# Patient Record
Sex: Female | Born: 1935 | Race: White | Hispanic: No | Marital: Married | Smoking: Never smoker
Health system: Southern US, Community
[De-identification: ages and names within clinical notes are randomized; demographics above are authoritative.]

## PROBLEM LIST (undated history)

## (undated) DIAGNOSIS — R51 Headache: Secondary | ICD-10-CM

## (undated) DIAGNOSIS — F329 Major depressive disorder, single episode, unspecified: Secondary | ICD-10-CM

## (undated) DIAGNOSIS — F32A Depression, unspecified: Secondary | ICD-10-CM

## (undated) DIAGNOSIS — R519 Headache, unspecified: Secondary | ICD-10-CM

## (undated) HISTORY — PX: CHOLECYSTECTOMY: SHX55

## (undated) HISTORY — PX: TOE SURGERY: SHX1073

## (undated) HISTORY — PX: ABDOMINAL HYSTERECTOMY: SHX81

## (undated) HISTORY — PX: EYE SURGERY: SHX253

---

## 1999-04-25 ENCOUNTER — Encounter: Payer: Self-pay | Admitting: Emergency Medicine

## 1999-04-25 ENCOUNTER — Emergency Department (HOSPITAL_COMMUNITY): Admission: EM | Admit: 1999-04-25 | Discharge: 1999-04-25 | Payer: Self-pay | Admitting: Emergency Medicine

## 1999-06-23 ENCOUNTER — Encounter: Payer: Self-pay | Admitting: Orthopedic Surgery

## 1999-06-23 ENCOUNTER — Encounter: Admission: RE | Admit: 1999-06-23 | Discharge: 1999-06-23 | Payer: Self-pay | Admitting: Orthopedic Surgery

## 1999-12-14 ENCOUNTER — Encounter: Payer: Self-pay | Admitting: Family Medicine

## 1999-12-14 ENCOUNTER — Encounter: Admission: RE | Admit: 1999-12-14 | Discharge: 1999-12-14 | Payer: Self-pay | Admitting: Family Medicine

## 2000-12-16 ENCOUNTER — Encounter: Admission: RE | Admit: 2000-12-16 | Discharge: 2000-12-16 | Payer: Self-pay | Admitting: Family Medicine

## 2000-12-16 ENCOUNTER — Encounter: Payer: Self-pay | Admitting: Family Medicine

## 2001-08-01 ENCOUNTER — Ambulatory Visit (HOSPITAL_COMMUNITY): Admission: RE | Admit: 2001-08-01 | Discharge: 2001-08-01 | Payer: Self-pay | Admitting: *Deleted

## 2001-12-23 ENCOUNTER — Encounter: Payer: Self-pay | Admitting: Family Medicine

## 2001-12-23 ENCOUNTER — Encounter: Admission: RE | Admit: 2001-12-23 | Discharge: 2001-12-23 | Payer: Self-pay | Admitting: Family Medicine

## 2003-01-27 ENCOUNTER — Encounter: Admission: RE | Admit: 2003-01-27 | Discharge: 2003-01-27 | Payer: Self-pay | Admitting: Family Medicine

## 2003-01-27 ENCOUNTER — Encounter: Payer: Self-pay | Admitting: Family Medicine

## 2003-05-18 ENCOUNTER — Encounter: Payer: Self-pay | Admitting: Family Medicine

## 2003-05-18 ENCOUNTER — Ambulatory Visit (HOSPITAL_COMMUNITY): Admission: RE | Admit: 2003-05-18 | Discharge: 2003-05-18 | Payer: Self-pay | Admitting: Family Medicine

## 2003-06-04 ENCOUNTER — Ambulatory Visit (HOSPITAL_COMMUNITY): Admission: RE | Admit: 2003-06-04 | Discharge: 2003-06-04 | Payer: Self-pay | Admitting: Family Medicine

## 2003-07-09 ENCOUNTER — Encounter: Admission: RE | Admit: 2003-07-09 | Discharge: 2003-08-03 | Payer: Self-pay | Admitting: Family Medicine

## 2003-12-08 ENCOUNTER — Other Ambulatory Visit: Admission: RE | Admit: 2003-12-08 | Discharge: 2003-12-08 | Payer: Self-pay | Admitting: Family Medicine

## 2004-02-03 ENCOUNTER — Encounter: Admission: RE | Admit: 2004-02-03 | Discharge: 2004-02-03 | Payer: Self-pay | Admitting: Family Medicine

## 2005-04-26 ENCOUNTER — Ambulatory Visit (HOSPITAL_COMMUNITY): Admission: RE | Admit: 2005-04-26 | Discharge: 2005-04-27 | Payer: Self-pay | Admitting: Urology

## 2005-05-30 ENCOUNTER — Encounter: Admission: RE | Admit: 2005-05-30 | Discharge: 2005-05-30 | Payer: Self-pay | Admitting: Family Medicine

## 2005-12-03 ENCOUNTER — Encounter (INDEPENDENT_AMBULATORY_CARE_PROVIDER_SITE_OTHER): Payer: Self-pay | Admitting: *Deleted

## 2005-12-03 ENCOUNTER — Ambulatory Visit (HOSPITAL_COMMUNITY): Admission: RE | Admit: 2005-12-03 | Discharge: 2005-12-04 | Payer: Self-pay | Admitting: General Surgery

## 2006-06-03 ENCOUNTER — Encounter: Admission: RE | Admit: 2006-06-03 | Discharge: 2006-06-03 | Payer: Self-pay | Admitting: Family Medicine

## 2007-07-02 ENCOUNTER — Encounter: Admission: RE | Admit: 2007-07-02 | Discharge: 2007-07-02 | Payer: Self-pay | Admitting: Family Medicine

## 2007-12-19 ENCOUNTER — Other Ambulatory Visit: Admission: RE | Admit: 2007-12-19 | Discharge: 2007-12-19 | Payer: Self-pay | Admitting: Family Medicine

## 2008-03-31 ENCOUNTER — Encounter: Admission: RE | Admit: 2008-03-31 | Discharge: 2008-04-30 | Payer: Self-pay | Admitting: Orthopaedic Surgery

## 2008-07-07 ENCOUNTER — Encounter: Admission: RE | Admit: 2008-07-07 | Discharge: 2008-07-07 | Payer: Self-pay | Admitting: Family Medicine

## 2009-06-10 ENCOUNTER — Encounter: Admission: RE | Admit: 2009-06-10 | Discharge: 2009-06-10 | Payer: Self-pay | Admitting: Family Medicine

## 2009-07-22 ENCOUNTER — Encounter: Admission: RE | Admit: 2009-07-22 | Discharge: 2009-07-22 | Payer: Self-pay | Admitting: Family Medicine

## 2010-08-27 ENCOUNTER — Encounter: Payer: Self-pay | Admitting: Family Medicine

## 2010-12-22 NOTE — Op Note (Signed)
NAME:  Gail Noble, Gail Noble            ACCOUNT NO.:  [id number]   MEDICAL RECORD NO.:  [id number]          PATIENT TYPE:  AMB   LOCATION:  DAY                          FACILITY:  [facility]   PHYSICIAN:  Excell Seltzer. Annabell Howells, M.D.    DATE OF BIRTH:  March 28, 1934   DATE OF PROCEDURE:  04/26/2005  DATE OF DISCHARGE:                                 OPERATIVE REPORT   PREOPERATIVE DIAGNOSES:  1.  Stress urinary incontinence.  2.  Cystocele.   POSTOPERATIVE DIAGNOSES:  1.  Stress urinary incontinence.  2.  Cystocele.   PROCEDURE:  1.  Anterior repair with mesh.  2.  Pubovaginal sling.   SURGEON:  Dr. Bjorn Pippin.   ASSISTANTS:  1.  Dr. Lorin Picket MacDiarmid.  2.  Dr. Glade Nurse   ANESTHESIA:  General.   SPECIMENS:  None.   DRAINS:  None.   PROCEDURE:  The patient was identified by wrist bracelet and brought to room  10 where she received preoperative antibiotics and was administered general  anesthesia.  She was prepped and draped in the usual sterile fashion, taking  care to prevent peripheral neuropathy and compartment syndrome.  Next a  weighted speculum was placed into her vagina, exposing her  anterior/posterior vagina and apex.  She was noted to have a mild to  moderate cystocele with excellent apical and posterior support.  Next we  placed a Foley catheter in anterior urethra; clear urine output was  obtained.  Next using 1% Marcaine with epinephrine, we hydrodissected the  anterior vaginal wall overlying the urethra to provide better dissection  planes and analgesia as well as hemostasis.  Next we made a 4-5 cm incision  approximately 3 cm proximal to the urethral meatus along the axis of the  vagina over the cystocele.  We sharply and bluntly dissected out thick  vaginal flaps.  We were able to dissect bilaterally, to but not through, the  endocervical fascia anteriorly and back to the ischial spine posteriorly.  Next we made 0.5 cm vertical skin incisions 5 cm lateral to the  clitoris  bilaterally, just lateral to the pubic rami.  We then made secondary marks 3  cm inferior and 2 cm lateral to the aforementioned marks over the, again,  the lateral edge of the pubic ramus.  Next, starting with the superior  sites, we passed the introducer trocars along the lateral aspect of the  pubic ramus to the surgeon's finger, out at the level of the pubovesical  fascia lateral to the Foley catheter.  This was done bilaterally and the  perigee superior arms were attached to the trocars.  Next this was repeated  with the posterior ports, [place] placing the surgeon's first finger on the right  ischial spine.  We passed the trocar through the level of skin just lateral  to the pubic ramus as close to the ischial spine as possible and back out at  the level of the pubocervical fascia.  Again, as with the superior  introducers, we checked the vaginal apex to make sure there was no  buttonholing of the vaginal fornix.  This was  repeated on the patient's  left.  Next we attached to the posterior arms of the perigee anterior repair  system.  The Foley catheter was then removed.  The patient underwent  pancystourethroscopy with 70-degree lenses with 400 mL of sterile water.  There were no foreign bodies or mucosal bleeding visualized.  Next we  removed the scope.  The bladder was drained again with Foley catheter.  The  trocars were pulled through, and the anterior peritoneum was set to the  appropriate tension reducing the cystocele.  Next the anterior hammock axis  polypropylene was removed, and it was affixed to the cystocele at the apex  both anterior and posteriorly at the midline with interrupted 2-0 Vicryl.  Next the coating over the kits were removed from the arms.  The arms were  trimmed at the level of the skin.  A Dermabond dressing was applied.  Next  the vaginal incision was closed with a running 3-0 Vicryl locking every  third suture.  Next we turned our attention to the  area of vaginal mucosa  between the superior portion of the aforementioned described incision in the  urethral meatus and mad a small 1 cm incision and created generous vaginal  flaps and dissected to but not through the pubocervical fascia.  Next we  made again 0.5 cm incisions 1/2 fingerbreadth superior to the pubic  symphysis, 1 cm each lateral to the midline.  Next using the spark  introducer trocars, we placed them through the dermis, on top of the pubic  ramus, guided down the posterior pubic ramus under the surgeon's first  finger and out the pubocervical fascia.  This was then repeated at the other  incision sites, coming out the contralateral pubocervical fascia.  We  checked to make sure there was no buttonholing of the introducers through  the vaginal mucosa.  Next the spark sling was attached, and the trocars were  removed.  It was appropriately tensioned.  Next we repeated  cystourethroscopy with 70-degree lens, 400 mL of sterile water.  No foreign  bodies or mucosal bleeding was seen.  The bilateral ureteral orifices were  seen to efflux clear urine bilaterally.  Next cystoscope was removed;  bladder was drained with Foley catheter.  The plastic covering over the  [facility] sling was removed.  It was trimmed at the level of skin.  Again, we  double-checked our tension by placing the DeBakey forceps behind the sling  anterior to the urethra proximally with one forceps with tension in between.  Next the vaginal mucosa was closed with running 3-0 chromic; the skin was  closed with Dermabond dressing, and the vagina was packed with Iodoform  gauze.  The patient's Foley catheter was attached to drainage bag.  He was  reversed from anesthesia which she tolerated without complication.  Please  note Dr. Bjorn Pippin was present and participated in all aspects of this  case.     ______________________________  Glade Nurse, MD      Excell Seltzer. Annabell Howells, M.D. Electronically Signed     MT/MEDQ  D:  04/26/2005  T:  04/26/2005  Job:  [id number]

## 2010-12-22 NOTE — Op Note (Signed)
NAMEGIANNY, Gail Noble               ACCOUNT NO.:  [id number]   MEDICAL RECORD NO.:  [id number]          PATIENT TYPE:  AMB   LOCATION:  SDS                          FACILITY:  [facility]   PHYSICIAN:  Gabrielle Dare. Janee Morn, M.D.DATE OF BIRTH:  Dec 01, 1933   DATE OF PROCEDURE:  12/03/2005  DATE OF DISCHARGE:                                 OPERATIVE REPORT   PREOPERATIVE DIAGNOSIS:  Symptomatic cholelithiasis   POSTOPERATIVE DIAGNOSIS:  Symptomatic cholelithiasis  Same.   PROCEDURE:  Laparoscopic cholecystectomy with intraoperative cholangiogram.   SURGEON:  Gabrielle Dare. Janee Morn, M.D.,   ASSISTANT:  Gita Kudo, M.D.   HISTORY OF PRESENT ILLNESS:  The patient is a 77 year old female who was  evaluated for episodic right upper quadrant pain.  Ultrasound demonstrated a  gallstone without any active cholecystitis.  She continued to have symptoms  and presents for elective cholecystectomy.   PROCEDURE IN DETAIL:  Informed consent was obtained.  The patient was  identified in the preop holding area.  She received intravenous antibiotics.  She was taken to the operating room.  General anesthesia was administered.  Her abdomen was prepped and draped in sterile a fashion.  The infraumbilical  area was infiltrated with 0.25% Marcaine with epinephrine.  An  infraumbilical incision was made.  Subcutaneous tissues were dissected down  revealing the anterior fascia.  This was divided sharply.  The peritoneal  cavity was then entered under direct vision without difficulty.  A Vicryl  pursestring suture was placed around the fascial opening.  A Hasson trocar  was inserted into the abdomen and the abdomen was insufflated with carbon  dioxide in standard fashion.  Laparoscopic exploration revealed a revealed  no obvious abnormalities.  Under direct vision an 11 mm epigastric and two 5-  mm lateral ports were then placed.  Marcaine 0.25% with epinephrine was used  at all port sites.  The dome of the  gallbladder was retracted  superomedially.  There were several filmy omental adhesions, which were  swept down off the gallbladder using Bovie cautery for excellent hemostasis.  This revealed the infundibulum.  The infundibulum was retracted  inferolaterally.  Dissection began laterally and progressed medially, easily  identifying the cystic duct.  Dissection continued until a large window was  created around the cystic duct between the cystic duct, the infundibulum of  the gallbladder and the liver.  Once this was accomplished with excellent  visualization, a clip was placed on the infundibulum-cystic duct junction.  A small nick was made in the cystic duct and a Reddick cholangiogram  catheter was inserted.  Intraoperative cholangiogram was obtained, which  demonstrated no common bile duct filling defects and a good length of cystic  duct.  The cholangiogram was completed and the catheter was removed.  Three  clips were placed proximally on the cystic duct and it was divided.  Further  dissection revealed an anterior branch of cystic artery, which was clipped  three times proximally, once distally, and divided.  The gallbladder was  then taken off the liver bed carefully.  Further dissection revealed a  posterior branch of  the cystic artery, which was clipped twice proximally,  once distally, and divided.  The gallbladder was removed from the liver bed  using the Bovie cautery and getting excellent hemostasis.  It was placed in  EndoCatch bag and taken out of the abdomen via the infraumbilical port site.  The gallbladder bed was rechecked and hemostasis was assured with Bovie  cautery.  Clips were in great position.  The abdomen was irrigated.  The  irrigation fluid returned clear.  Once irrigation fluid was evacuated, the  liver bed was rechecked and there was no bleeding or bile leakage.  Ports  were then removed under direct vision.  The pneumoperitoneum was released.  The Hasson  trocar was removed.  The infraumbilical fascia was closed by  tying the 0 Vicryl pursestring suture with care not to trap any intra-  abdominal contents.  All four wounds were copiously irrigated.  Some  additional local anesthetic was injected and the skin of each was closed  with running a 4-0 Vicryl subcuticular stitch.  Sponge, needle and  instrument counts were correct.  Benzoin, Steri-Strips and sterile dressings  were applied.  The patient tolerated the procedure well without apparent  complication and was taken to the recovery room in stable condition.      Gabrielle Dare Janee Morn, M.D.  Electronically Signed     BET/MEDQ  D:  12/03/2005  T:  12/03/2005  Job:  [id number]   cc:   Holley Bouche, M.D.  Fax: [phone]

## 2010-12-22 NOTE — Discharge Summary (Signed)
NAMEDAISHIA, Gail Noble               ACCOUNT NO.:  [id number]   MEDICAL RECORD NO.:  [id number]          PATIENT TYPE:  OIB   LOCATION:  5733                         FACILITY:  [facility]   PHYSICIAN:  Gabrielle Dare. Janee Morn, M.D.DATE OF BIRTH:  17-Jun-1934   DATE OF ADMISSION:  12/03/2005  DATE OF DISCHARGE:  12/04/2005                                 DISCHARGE SUMMARY   DISCHARGE DIAGNOSIS:  1.  Symptomatic cholelithiasis.  2.  Status post laparoscopic cholecystectomy with intraoperative      cholangiogram.   HISTORY OF PRESENT ILLNESS:  Patient is a 77 year old female who had  symptomatic cholelithiasis and presented for elective cholecystectomy.   HOSPITAL COURSE:  Patient underwent an uncomplicated laparoscopic  cholecystectomy and intraoperative cholangiogram.  Postoperatively she  remained afebrile and hemodynamically stable.  She initially had some  urinary retention requiring straight catheterization, but later was able to  void spontaneously twice without any further difficulty.  She had no further  problems.  She is discharged home in stable condition on postoperative day  #1.   DISCHARGE DIET:  Low-fat.   DISCHARGE ACTIVITY:  No lifting.   DISCHARGE MEDICATIONS:  Percocet 5/325 one to two every six hours as needed  for pain.  Follow-up is with myself in three weeks.      Gabrielle Dare Janee Morn, M.D.  Electronically Signed     BET/MEDQ  D:  12/04/2005  T:  12/04/2005  Job:  [id number]

## 2011-06-22 ENCOUNTER — Other Ambulatory Visit: Payer: Self-pay | Admitting: Family Medicine

## 2011-06-22 DIAGNOSIS — R519 Headache, unspecified: Secondary | ICD-10-CM

## 2011-06-25 ENCOUNTER — Ambulatory Visit
Admission: RE | Admit: 2011-06-25 | Discharge: 2011-06-25 | Disposition: A | Discharge status: Home / Self Care | Payer: Medicare Other | Source: Ambulatory Visit | Attending: Family Medicine | Admitting: Family Medicine

## 2011-06-25 DIAGNOSIS — R519 Headache, unspecified: Secondary | ICD-10-CM

## 2012-02-16 ENCOUNTER — Encounter (HOSPITAL_COMMUNITY): Payer: Self-pay | Admitting: *Deleted

## 2012-02-16 ENCOUNTER — Emergency Department (HOSPITAL_COMMUNITY)
Admission: EM | Admit: 2012-02-16 | Discharge: 2012-02-16 | Disposition: A | Discharge status: Home / Self Care | Payer: Medicare Other | Attending: Emergency Medicine | Admitting: Emergency Medicine

## 2012-02-16 DIAGNOSIS — F329 Major depressive disorder, single episode, unspecified: Secondary | ICD-10-CM | POA: Insufficient documentation

## 2012-02-16 DIAGNOSIS — T63461A Toxic effect of venom of wasps, accidental (unintentional), initial encounter: Secondary | ICD-10-CM | POA: Insufficient documentation

## 2012-02-16 DIAGNOSIS — T63441A Toxic effect of venom of bees, accidental (unintentional), initial encounter: Secondary | ICD-10-CM

## 2012-02-16 DIAGNOSIS — T6391XA Toxic effect of contact with unspecified venomous animal, accidental (unintentional), initial encounter: Secondary | ICD-10-CM | POA: Insufficient documentation

## 2012-02-16 DIAGNOSIS — F3289 Other specified depressive episodes: Secondary | ICD-10-CM | POA: Insufficient documentation

## 2012-02-16 HISTORY — DX: Headache, unspecified: R51.9

## 2012-02-16 HISTORY — DX: Major depressive disorder, single episode, unspecified: F32.9

## 2012-02-16 HISTORY — DX: Depression, unspecified: F32.A

## 2012-02-16 HISTORY — DX: Headache: R51

## 2012-02-16 MED ORDER — FAMOTIDINE 20 MG PO TABS
20.0000 mg | ORAL_TABLET | Freq: Once | ORAL | Status: AC
  Administered 2012-02-16: 20 mg via ORAL
  Filled 2012-02-16: qty 1

## 2012-02-16 NOTE — ED Provider Notes (Signed)
History     CSN: [id number]  Arrival date & time 02/16/12  2035   First MD Initiated Contact with Patient 02/16/12 2048      Chief Complaint  Patient presents with  . Insect Bite    (Consider location/radiation/quality/duration/timing/severity/associated sxs/prior treatment) HPI Comments: Patient presents with a complaint of a yellow jacket sting to her left upper lip. This occurred approximately 3 hours prior to arrival. Patient noticed left facial and cheek swelling approximately an hour later. She took 2 Benadryl without relief. She's not having trouble breathing or trouble swallowing. She does not have a history of anaphylaxis to bee stings. Nothing makes the symptoms better or worse. Course is constant. Onset was acute. Patient is also treated herself at home with some topical steroid cream.  The history is provided by the patient.    Past Medical History  Diagnosis Date  . Depression   . Generalized headaches     Past Surgical History  Procedure Date  . Abdominal hysterectomy   . Cholecystectomy   . Eye surgery   . Toe surgery     History reviewed. No pertinent family history.  History  Substance Use Topics  . Smoking status: Never Smoker   . Smokeless tobacco: Not on file  . Alcohol Use: No    OB History    Grav Para Term Preterm Abortions TAB SAB Ect Mult Living                  Review of Systems  Constitutional: Negative for fever.  HENT: Positive for facial swelling (No airway swelling). Negative for sore throat and trouble swallowing.   Eyes: Negative for redness.  Respiratory: Negative for cough, shortness of breath, wheezing and stridor.   Cardiovascular: Negative for chest pain and palpitations.  Gastrointestinal: Negative for nausea, vomiting, abdominal pain and diarrhea.  Musculoskeletal: Negative for myalgias.  Skin: Positive for wound. Negative for rash.  Neurological: Negative for light-headedness and headaches.  Hematological: Negative  for adenopathy.  Psychiatric/Behavioral: Negative for confusion.    Allergies  Review of patient's allergies indicates no known allergies.  Home Medications  No current outpatient prescriptions on file.  BP 118/50  Pulse 66  Temp 97.7 F (36.5 C) (Oral)  Resp 20  SpO2 99%  Physical Exam  Nursing note and vitals reviewed. Constitutional: She appears well-developed and well-nourished.  HENT:  Head: Normocephalic and atraumatic.    Right Ear: External ear normal.  Left Ear: External ear normal.  Nose: Nose normal.  Mouth/Throat: Uvula is midline and oropharynx is clear and moist. Mucous membranes are not dry. No uvula swelling.  Eyes: Conjunctivae are normal. Right eye exhibits no discharge. Left eye exhibits no discharge.  Neck: Normal range of motion. Neck supple.  Cardiovascular: Normal rate, regular rhythm and normal heart sounds.   Pulmonary/Chest: Effort normal and breath sounds normal.  Abdominal: Soft. There is no tenderness.  Neurological: She is alert.  Skin: Skin is warm and dry.  Psychiatric: She has a normal mood and affect.    ED Course  Procedures (including critical care time)  Labs Reviewed - No data to display No results found.   1. Bee sting     9:11 PM Patient seen and examined. Work-up initiated. Medications ordered.   Vital signs reviewed and are as follows: Filed Vitals:   02/16/12 2040  BP: 118/50  Pulse: 66  Temp: 97.7 F (36.5 C)  Resp: 20    Patient observed for approx 90 minutes. Symptoms are  stable.   Counseled on conservative management with ice pack, benadryl, NSAIDs, H2 blocker. Urged return if worsening. Patient verbalizes understanding and agrees with plan.   Patient was discussed with and seen by Dr. Juleen China during ED stay.    MDM  Bee sting with local rxn, not involving airway, no type 1 hypersensitivity. Conservative management indicated. Exam stable.         [place], [place] 02/18/12 [phone]

## 2012-02-16 NOTE — ED Notes (Signed)
Pt was stung by bee in center of mouth today at 5:30 pm.  Lip swelling noted, able to control secretions.  No respiratory distress.  No known allergy to bees.

## 2012-02-21 NOTE — ED Provider Notes (Signed)
Medical screening examination/treatment/procedure(s) were conducted as a shared visit with non-physician practitioner(s) and myself.  I personally evaluated the patient during the encounter  Pt with insect sting to lip. Thinks yellow jacket. Exam consistent with local reaction which is improving. HD stable. Plan continued symptomatic tx. Return precuations discussed. Outpt fu.  Raeford Razor, MD 02/21/12 [phone]

## 2012-05-22 ENCOUNTER — Other Ambulatory Visit: Payer: Self-pay | Admitting: Family Medicine

## 2012-05-22 DIAGNOSIS — M8580 Other specified disorders of bone density and structure, unspecified site: Secondary | ICD-10-CM

## 2012-05-27 ENCOUNTER — Ambulatory Visit
Admission: RE | Admit: 2012-05-27 | Discharge: 2012-05-27 | Disposition: A | Discharge status: Home / Self Care | Payer: Medicare Other | Source: Ambulatory Visit | Attending: Family Medicine | Admitting: Family Medicine

## 2012-05-27 ENCOUNTER — Other Ambulatory Visit: Payer: Self-pay | Admitting: Family Medicine

## 2012-05-27 DIAGNOSIS — Z1231 Encounter for screening mammogram for malignant neoplasm of breast: Secondary | ICD-10-CM

## 2012-05-27 DIAGNOSIS — M8580 Other specified disorders of bone density and structure, unspecified site: Secondary | ICD-10-CM

## 2012-05-28 ENCOUNTER — Ambulatory Visit: Payer: Medicare Other

## 2012-06-03 ENCOUNTER — Ambulatory Visit
Admission: RE | Admit: 2012-06-03 | Discharge: 2012-06-03 | Disposition: A | Discharge status: Home / Self Care | Payer: Medicare Other | Source: Ambulatory Visit | Attending: Family Medicine | Admitting: Family Medicine

## 2012-06-03 DIAGNOSIS — Z1231 Encounter for screening mammogram for malignant neoplasm of breast: Secondary | ICD-10-CM

## 2015-10-07 DIAGNOSIS — R413 Other amnesia: Secondary | ICD-10-CM | POA: Diagnosis not present

## 2015-10-07 DIAGNOSIS — R69 Illness, unspecified: Secondary | ICD-10-CM | POA: Diagnosis not present

## 2015-10-07 DIAGNOSIS — E7800 Pure hypercholesterolemia, unspecified: Secondary | ICD-10-CM | POA: Diagnosis not present

## 2015-11-25 DIAGNOSIS — R69 Illness, unspecified: Secondary | ICD-10-CM | POA: Diagnosis not present

## 2015-11-25 DIAGNOSIS — E7800 Pure hypercholesterolemia, unspecified: Secondary | ICD-10-CM | POA: Diagnosis not present

## 2015-11-25 DIAGNOSIS — R413 Other amnesia: Secondary | ICD-10-CM | POA: Diagnosis not present

## 2016-02-21 DIAGNOSIS — E7800 Pure hypercholesterolemia, unspecified: Secondary | ICD-10-CM | POA: Diagnosis not present

## 2016-02-21 DIAGNOSIS — Z0000 Encounter for general adult medical examination without abnormal findings: Secondary | ICD-10-CM | POA: Diagnosis not present

## 2016-02-21 DIAGNOSIS — R69 Illness, unspecified: Secondary | ICD-10-CM | POA: Diagnosis not present

## 2016-02-28 DIAGNOSIS — R69 Illness, unspecified: Secondary | ICD-10-CM | POA: Diagnosis not present

## 2016-02-28 DIAGNOSIS — R413 Other amnesia: Secondary | ICD-10-CM | POA: Diagnosis not present

## 2016-02-28 DIAGNOSIS — E7800 Pure hypercholesterolemia, unspecified: Secondary | ICD-10-CM | POA: Diagnosis not present

## 2016-02-28 DIAGNOSIS — L309 Dermatitis, unspecified: Secondary | ICD-10-CM | POA: Diagnosis not present

## 2016-05-08 DIAGNOSIS — Z23 Encounter for immunization: Secondary | ICD-10-CM | POA: Diagnosis not present

## 2016-05-09 DIAGNOSIS — R413 Other amnesia: Secondary | ICD-10-CM | POA: Diagnosis not present

## 2016-05-09 DIAGNOSIS — E7800 Pure hypercholesterolemia, unspecified: Secondary | ICD-10-CM | POA: Diagnosis not present

## 2016-05-09 DIAGNOSIS — R69 Illness, unspecified: Secondary | ICD-10-CM | POA: Diagnosis not present

## 2016-05-09 DIAGNOSIS — Z23 Encounter for immunization: Secondary | ICD-10-CM | POA: Diagnosis not present

## 2016-09-05 DIAGNOSIS — E7800 Pure hypercholesterolemia, unspecified: Secondary | ICD-10-CM | POA: Diagnosis not present

## 2016-09-05 DIAGNOSIS — G3184 Mild cognitive impairment, so stated: Secondary | ICD-10-CM | POA: Diagnosis not present

## 2016-09-05 DIAGNOSIS — Z0000 Encounter for general adult medical examination without abnormal findings: Secondary | ICD-10-CM | POA: Diagnosis not present

## 2016-09-05 DIAGNOSIS — Z79899 Other long term (current) drug therapy: Secondary | ICD-10-CM | POA: Diagnosis not present

## 2016-09-05 DIAGNOSIS — R69 Illness, unspecified: Secondary | ICD-10-CM | POA: Diagnosis not present

## 2016-09-18 DIAGNOSIS — R69 Illness, unspecified: Secondary | ICD-10-CM | POA: Diagnosis not present

## 2016-09-18 DIAGNOSIS — R413 Other amnesia: Secondary | ICD-10-CM | POA: Diagnosis not present

## 2016-09-18 DIAGNOSIS — E7800 Pure hypercholesterolemia, unspecified: Secondary | ICD-10-CM | POA: Diagnosis not present

## 2016-11-12 ENCOUNTER — Ambulatory Visit: Payer: Medicare Other | Admitting: Neurology

## 2016-11-21 ENCOUNTER — Ambulatory Visit: Payer: Medicare Other | Admitting: Neurology

## 2016-12-17 DIAGNOSIS — R69 Illness, unspecified: Secondary | ICD-10-CM | POA: Diagnosis not present

## 2016-12-17 DIAGNOSIS — E7800 Pure hypercholesterolemia, unspecified: Secondary | ICD-10-CM | POA: Diagnosis not present

## 2016-12-17 DIAGNOSIS — R413 Other amnesia: Secondary | ICD-10-CM | POA: Diagnosis not present

## 2017-01-15 ENCOUNTER — Encounter: Payer: Self-pay | Admitting: Neurology

## 2017-01-15 ENCOUNTER — Ambulatory Visit (INDEPENDENT_AMBULATORY_CARE_PROVIDER_SITE_OTHER): Payer: Medicare HMO | Admitting: Neurology

## 2017-01-15 ENCOUNTER — Other Ambulatory Visit: Payer: Medicare HMO

## 2017-01-15 VITALS — BP 140/62 | HR 84 | Ht 64.0 in | Wt 132.0 lb

## 2017-01-15 DIAGNOSIS — R69 Illness, unspecified: Secondary | ICD-10-CM | POA: Diagnosis not present

## 2017-01-15 DIAGNOSIS — F0390 Unspecified dementia without behavioral disturbance: Secondary | ICD-10-CM

## 2017-01-15 DIAGNOSIS — F03A0 Unspecified dementia, mild, without behavioral disturbance, psychotic disturbance, mood disturbance, and anxiety: Secondary | ICD-10-CM

## 2017-01-15 LAB — TSH: TSH: 2.88 mIU/L

## 2017-01-15 NOTE — Patient Instructions (Addendum)
1. Schedule MRI brain without contrast 2. Bloodwork for TSH, B12 3. Please check on Aricept and let us know if you are taking it 4. Refer to [place] for medication management 5. No further driving 6. Follow-up in 6 months, call for any changes

## 2017-01-15 NOTE — Progress Notes (Signed)
NEUROLOGY CONSULTATION NOTE  Gail Noble MRN: [id number] DOB: 1934/03/09  Referring provider: Dr. Shirline Frees Primary care provider: Dr. Shirline Frees  Reason for consult:  Memory loss  Dear Dr Kenton Kingfisher:  Thank you for your kind referral of Gail Noble for consultation of the above symptoms. Although her history is well known to you, please allow me to reiterate it for the purpose of our medical record. The patient was accompanied to the clinic by her daughter who also provides collateral information. Records and images were personally reviewed where available.  HISTORY OF PRESENT ILLNESS: This is an 83 year old right-handed woman with a history of hyperlipidemia, depression, migraines, presenting for evaluation of memory loss. She feels her memory is pretty good. She denies getting lost driving and states she is in charge of bills without any missed payments. She states she is in charge of medications for herself and her husband with dementia. She does endorse occasionally misplacing things and occasional word-finding difficulties. She was started on Aricept a year ago, but she and her daughter are unsure if she has been taking it. Her daughter tells a different story. She started noticing changes in her mother's memory over the past year. She gives some examples, a couple of weeks ago her son bought homemade ice cream. She later on asked her daughter-in-law how long she had been making homemade ice cream, and then asked when she would make some more. Her daughter fills out her father's pillbox, and a week ago she asked the patient to get aspirin from [facility]. She gave her the bottle to bring, but she could not find it later on and they found it in the trash. She does not recall throwing it away. Her daughter reports that over the past year, they found out she was paying for 2 TV services, one of which they don't have. She would get a bill and pay it. She has gotten lost  driving, they had missed two appointments to her husband's neurologist in [facility] because she got lost driving each time. She ended up in [facility] last week. She got there 1.5 hours late. Several months ago, her daughter called her because she was gone a long time and she said she was on her way. Apparently, she got lost and knocked on someone's house to ask directions. She does not remember this. The house is organized, no difficulties with dressing/bathing. No personality changes or hallucinations.   She has occasional migraines every 1-2 weeks. Otherwise she denies any dizziness, diplopia, neck/back pain, dysarthria/dysphagia, focal numbness/tingling/weakness, bowel/bladder dysfunction, anosmia, tremors. Her father, his siblings had dementia. No history of significant head injuries, she drinks alcohol socially. She finished 12th grade.   PAST MEDICAL HISTORY: Past Medical History:  Diagnosis Date  . Depression   . Generalized headaches     PAST SURGICAL HISTORY: Past Surgical History:  Procedure Laterality Date  . ABDOMINAL HYSTERECTOMY    . CHOLECYSTECTOMY    . EYE SURGERY    . TOE SURGERY      MEDICATIONS: Current Outpatient Prescriptions on File Prior to Visit  Medication Sig Dispense Refill  . atorvastatin (LIPITOR) 20 MG tablet Take 20 mg by mouth daily.    . butalbital-acetaminophen-caffeine (FIORICET, ESGIC) 50-325-40 MG per tablet Take 1 tablet by mouth 2 (two) times daily as needed.    . calcium carbonate 1250 MG capsule Take 1,250 mg by mouth 2 (two) times daily with a meal.    . cholecalciferol (  VITAMIN D) 1000 UNITS tablet Take 1,000 Units by mouth daily.    . diazepam (VALIUM) 5 MG tablet Take 5 mg by mouth every 6 (six) hours as needed.    . fish oil-omega-3 fatty acids 1000 MG capsule Take 2 g by mouth daily.    . folic acid (FOLVITE) 1 MG tablet Take 1 mg by mouth daily.    . Garlic 10 MG CAPS Take 1 tablet by mouth daily.    . sertraline (ZOLOFT) 100 MG  tablet Take 100 mg by mouth daily.    . vitamin B-12 (CYANOCOBALAMIN) 100 MCG tablet Take 50 mcg by mouth daily.    Marland Kitchen zolpidem (AMBIEN) 10 MG tablet Take 10 mg by mouth at bedtime as needed.     No current facility-administered medications on file prior to visit.     ALLERGIES: No Known Allergies  FAMILY HISTORY: No family history on file.  SOCIAL HISTORY: Social History   Social History  . Marital status: Married    Spouse name: N/A  . Number of children: N/A  . Years of education: N/A   Occupational History  . Not on file.   Social History Main Topics  . Smoking status: Never Smoker  . Smokeless tobacco: Not on file  . Alcohol use No  . Drug use: No  . Sexual activity: Not on file   Other Topics Concern  . Not on file   Social History Narrative  . No narrative on file    REVIEW OF SYSTEMS: Constitutional: No fevers, chills, or sweats, no generalized fatigue, change in appetite Eyes: No visual changes, double vision, eye pain Ear, nose and throat: No hearing loss, ear pain, nasal congestion, sore throat Cardiovascular: No chest pain, palpitations Respiratory:  No shortness of breath at rest or with exertion, wheezes GastrointestinaI: No nausea, vomiting, diarrhea, abdominal pain, fecal incontinence Genitourinary:  No dysuria, urinary retention or frequency Musculoskeletal:  No neck pain, back pain Integumentary: No rash, pruritus, skin lesions Neurological: as above Psychiatric: No depression, insomnia, anxiety Endocrine: No palpitations, fatigue, diaphoresis, mood swings, change in appetite, change in weight, increased thirst Hematologic/Lymphatic:  No anemia, purpura, petechiae. Allergic/Immunologic: no itchy/runny eyes, nasal congestion, recent allergic reactions, rashes  PHYSICAL EXAM: Vitals:   01/15/17 1033  BP: 140/62  Pulse: 84   General: No acute distress Head:  Normocephalic/atraumatic Eyes: Fundoscopic exam shows bilateral sharp discs, no  vessel changes, exudates, or hemorrhages Neck: supple, no paraspinal tenderness, full range of motion Back: No paraspinal tenderness Heart: regular rate and rhythm Lungs: Clear to auscultation bilaterally. Vascular: No carotid bruits. Skin/Extremities: No rash, no edema Neurological Exam: Mental status: alert and oriented to person, place, and time, no dysarthria or aphasia, Fund of knowledge is appropriate.  Recent and remote memory are intact.  Attention and concentration are normal.    Able to name objects and repeat phrases.  Montreal Cognitive Assessment  01/15/2017  Visuospatial/ Executive (0/5) 4  Naming (0/3) 3  Attention: Read list of digits (0/2) 2  Attention: Read list of letters (0/1) 1  Attention: Serial 7 subtraction starting at 100 (0/3) 2  Language: Repeat phrase (0/2) 2  Language : Fluency (0/1) 0  Abstraction (0/2) 2  Delayed Recall (0/5) 0  Orientation (0/6) 5  Total 21   Cranial nerves: CN I: not tested CN II: pupils equal, round and reactive to light, visual fields intact, fundi unremarkable. CN III, IV, VI:  full range of motion, no nystagmus, no ptosis CN V: facial  sensation intact CN VII: upper and lower face symmetric CN VIII: hearing intact to finger rub CN IX, X: gag intact, uvula midline CN XI: sternocleidomastoid and trapezius muscles intact CN XII: tongue midline Bulk & Tone: normal, no cogwheeling, no fasciculations. Motor: 5/5 throughout with no pronator drift. Sensation: intact to light touch, cold, pin, vibration and joint position sense.  No extinction to double simultaneous stimulation.  Romberg test negative Deep Tendon Reflexes: +2 on both UE, +1 both LE, no ankle clonus Plantar responses: downgoing bilaterally Cerebellar: no incoordination on finger to nose, heel to shin. No dysdiadochokinesia Gait: narrow-based and steady, mild difficulty with tandem walk Tremor: none  IMPRESSION: This is an 83 year old right-handed woman with a history  of hyperlipidemia, migraines, depression, presenting with worsening memory. Her neurological exam is non-focal, MOCA score today 21/30, indicating mild cognitive impairment, however by history suggestive of mild dementia. She has gotten lost driving a few times. We discussed different causes of memory loss, check TSH and B12. MRI brain without contrast will be ordered to assess for underlying structural abnormality. Her daughter will check on pharmacy refills if she was getting the Aricept regularly, and will start fixing her pillbox for her. They are agreeable to [facility] for a visiting nurse to help with medication management. I discussed with her recommendation for no further driving. She will follow-up in 6 months and knows to call for any changes.   Thank you for allowing me to participate in the care of this patient. Please do not hesitate to call for any questions or concerns.   Ellouise Newer, M.D.  CC: Dr. Kenton Kingfisher

## 2017-01-16 ENCOUNTER — Encounter: Payer: Self-pay | Admitting: Neurology

## 2017-01-16 DIAGNOSIS — F0390 Unspecified dementia without behavioral disturbance: Secondary | ICD-10-CM | POA: Insufficient documentation

## 2017-01-16 DIAGNOSIS — F03A0 Unspecified dementia, mild, without behavioral disturbance, psychotic disturbance, mood disturbance, and anxiety: Secondary | ICD-10-CM | POA: Insufficient documentation

## 2017-01-16 LAB — VITAMIN B12: Vitamin B-12: 1210 pg/mL — ABNORMAL HIGH (ref 200–1100)

## 2017-01-17 ENCOUNTER — Encounter: Payer: Self-pay | Admitting: Neurology

## 2017-01-17 ENCOUNTER — Telehealth: Payer: Self-pay

## 2017-01-17 NOTE — Telephone Encounter (Signed)
-----   Message from Cameron Sprang, MD sent at 01/16/2017  9:05 AM EDT ----- Pls let him know thyroid and B12 levels are good. Thanks

## 2017-01-17 NOTE — Telephone Encounter (Signed)
Spoke with pt daughter, Clarene Critchley, relaying message below.  I also let Clarene Critchley know that we received the email she had sent earlier today, but the POA did not appear to be attached on our end.  She will fax copy.

## 2017-01-25 ENCOUNTER — Ambulatory Visit
Admission: RE | Admit: 2017-01-25 | Discharge: 2017-01-25 | Disposition: A | Discharge status: Home / Self Care | Payer: Medicare HMO | Source: Ambulatory Visit | Attending: Neurology | Admitting: Neurology

## 2017-01-25 DIAGNOSIS — R413 Other amnesia: Secondary | ICD-10-CM | POA: Diagnosis not present

## 2017-01-25 DIAGNOSIS — F03A0 Unspecified dementia, mild, without behavioral disturbance, psychotic disturbance, mood disturbance, and anxiety: Secondary | ICD-10-CM

## 2017-01-25 DIAGNOSIS — F0390 Unspecified dementia without behavioral disturbance: Secondary | ICD-10-CM

## 2017-01-29 ENCOUNTER — Telehealth: Payer: Self-pay

## 2017-01-29 ENCOUNTER — Encounter: Payer: Self-pay | Admitting: Neurology

## 2017-01-29 NOTE — Telephone Encounter (Signed)
-----   Message from Cameron Sprang, MD sent at 01/29/2017 10:06 AM EDT ----- Pls let daughter know I reviewed MRI brain, no evidence of tumor, stroke, or bleed. It shows age-related changes. Thanks

## 2017-01-29 NOTE — Telephone Encounter (Signed)
Spoke with pt daughter, Helene Kelp, relaying message below.  She is wondering if Dr. Delice Lesch has any idea as to what is causing pt's dementia.

## 2017-02-19 ENCOUNTER — Other Ambulatory Visit: Payer: Self-pay | Admitting: Family Medicine

## 2017-02-19 ENCOUNTER — Other Ambulatory Visit: Payer: Medicare HMO

## 2017-02-19 ENCOUNTER — Ambulatory Visit
Admission: RE | Admit: 2017-02-19 | Discharge: 2017-02-19 | Disposition: A | Discharge status: Home / Self Care | Payer: Medicare HMO | Source: Ambulatory Visit | Attending: Family Medicine | Admitting: Family Medicine

## 2017-02-19 DIAGNOSIS — R410 Disorientation, unspecified: Secondary | ICD-10-CM

## 2017-02-19 DIAGNOSIS — R51 Headache: Secondary | ICD-10-CM | POA: Diagnosis not present

## 2017-02-19 DIAGNOSIS — R69 Illness, unspecified: Secondary | ICD-10-CM | POA: Diagnosis not present

## 2017-02-19 DIAGNOSIS — N3000 Acute cystitis without hematuria: Secondary | ICD-10-CM | POA: Diagnosis not present

## 2017-02-21 ENCOUNTER — Inpatient Hospital Stay (HOSPITAL_COMMUNITY): Payer: Medicare HMO

## 2017-02-21 ENCOUNTER — Inpatient Hospital Stay (HOSPITAL_COMMUNITY)
Admission: EM | Admit: 2017-02-21 | Discharge: 2017-03-06 | DRG: 840 | Disposition: E | Payer: Medicare HMO | Attending: Internal Medicine | Admitting: Internal Medicine

## 2017-02-21 ENCOUNTER — Encounter (HOSPITAL_COMMUNITY): Payer: Self-pay | Admitting: Emergency Medicine

## 2017-02-21 ENCOUNTER — Emergency Department (HOSPITAL_COMMUNITY): Payer: Medicare HMO

## 2017-02-21 DIAGNOSIS — R531 Weakness: Secondary | ICD-10-CM | POA: Diagnosis not present

## 2017-02-21 DIAGNOSIS — E876 Hypokalemia: Secondary | ICD-10-CM | POA: Diagnosis present

## 2017-02-21 DIAGNOSIS — A419 Sepsis, unspecified organism: Secondary | ICD-10-CM | POA: Diagnosis present

## 2017-02-21 DIAGNOSIS — R591 Generalized enlarged lymph nodes: Secondary | ICD-10-CM | POA: Diagnosis not present

## 2017-02-21 DIAGNOSIS — R233 Spontaneous ecchymoses: Secondary | ICD-10-CM | POA: Diagnosis not present

## 2017-02-21 DIAGNOSIS — C8590 Non-Hodgkin lymphoma, unspecified, unspecified site: Secondary | ICD-10-CM | POA: Diagnosis not present

## 2017-02-21 DIAGNOSIS — I959 Hypotension, unspecified: Secondary | ICD-10-CM | POA: Diagnosis not present

## 2017-02-21 DIAGNOSIS — M79651 Pain in right thigh: Secondary | ICD-10-CM | POA: Diagnosis not present

## 2017-02-21 DIAGNOSIS — D72820 Lymphocytosis (symptomatic): Secondary | ICD-10-CM | POA: Diagnosis not present

## 2017-02-21 DIAGNOSIS — E790 Hyperuricemia without signs of inflammatory arthritis and tophaceous disease: Secondary | ICD-10-CM | POA: Diagnosis not present

## 2017-02-21 DIAGNOSIS — Z90710 Acquired absence of both cervix and uterus: Secondary | ICD-10-CM

## 2017-02-21 DIAGNOSIS — S79921A Unspecified injury of right thigh, initial encounter: Secondary | ICD-10-CM | POA: Diagnosis not present

## 2017-02-21 DIAGNOSIS — D72829 Elevated white blood cell count, unspecified: Secondary | ICD-10-CM | POA: Diagnosis not present

## 2017-02-21 DIAGNOSIS — K769 Liver disease, unspecified: Secondary | ICD-10-CM | POA: Diagnosis not present

## 2017-02-21 DIAGNOSIS — D696 Thrombocytopenia, unspecified: Secondary | ICD-10-CM | POA: Diagnosis not present

## 2017-02-21 DIAGNOSIS — R680 Hypothermia, not associated with low environmental temperature: Secondary | ICD-10-CM | POA: Diagnosis present

## 2017-02-21 DIAGNOSIS — Z807 Family history of other malignant neoplasms of lymphoid, hematopoietic and related tissues: Secondary | ICD-10-CM | POA: Diagnosis not present

## 2017-02-21 DIAGNOSIS — N390 Urinary tract infection, site not specified: Secondary | ICD-10-CM | POA: Diagnosis present

## 2017-02-21 DIAGNOSIS — E872 Acidosis, unspecified: Secondary | ICD-10-CM

## 2017-02-21 DIAGNOSIS — D649 Anemia, unspecified: Secondary | ICD-10-CM | POA: Diagnosis not present

## 2017-02-21 DIAGNOSIS — I313 Pericardial effusion (noninflammatory): Secondary | ICD-10-CM | POA: Diagnosis not present

## 2017-02-21 DIAGNOSIS — Z79899 Other long term (current) drug therapy: Secondary | ICD-10-CM

## 2017-02-21 DIAGNOSIS — R5383 Other fatigue: Secondary | ICD-10-CM | POA: Diagnosis not present

## 2017-02-21 DIAGNOSIS — F0390 Unspecified dementia without behavioral disturbance: Secondary | ICD-10-CM | POA: Diagnosis present

## 2017-02-21 DIAGNOSIS — Z515 Encounter for palliative care: Secondary | ICD-10-CM | POA: Diagnosis present

## 2017-02-21 DIAGNOSIS — J90 Pleural effusion, not elsewhere classified: Secondary | ICD-10-CM | POA: Diagnosis present

## 2017-02-21 DIAGNOSIS — R102 Pelvic and perineal pain: Secondary | ICD-10-CM | POA: Diagnosis not present

## 2017-02-21 DIAGNOSIS — N3001 Acute cystitis with hematuria: Secondary | ICD-10-CM | POA: Diagnosis not present

## 2017-02-21 DIAGNOSIS — E785 Hyperlipidemia, unspecified: Secondary | ICD-10-CM | POA: Diagnosis present

## 2017-02-21 DIAGNOSIS — Z66 Do not resuscitate: Secondary | ICD-10-CM | POA: Diagnosis present

## 2017-02-21 DIAGNOSIS — S0990XA Unspecified injury of head, initial encounter: Secondary | ICD-10-CM | POA: Diagnosis not present

## 2017-02-21 DIAGNOSIS — R7989 Other specified abnormal findings of blood chemistry: Secondary | ICD-10-CM | POA: Diagnosis not present

## 2017-02-21 DIAGNOSIS — S299XXA Unspecified injury of thorax, initial encounter: Secondary | ICD-10-CM | POA: Diagnosis not present

## 2017-02-21 DIAGNOSIS — R160 Hepatomegaly, not elsewhere classified: Secondary | ICD-10-CM | POA: Diagnosis not present

## 2017-02-21 DIAGNOSIS — R918 Other nonspecific abnormal finding of lung field: Secondary | ICD-10-CM | POA: Diagnosis not present

## 2017-02-21 DIAGNOSIS — R69 Illness, unspecified: Secondary | ICD-10-CM | POA: Diagnosis not present

## 2017-02-21 DIAGNOSIS — S8991XA Unspecified injury of right lower leg, initial encounter: Secondary | ICD-10-CM | POA: Diagnosis not present

## 2017-02-21 DIAGNOSIS — F329 Major depressive disorder, single episode, unspecified: Secondary | ICD-10-CM | POA: Diagnosis present

## 2017-02-21 DIAGNOSIS — D638 Anemia in other chronic diseases classified elsewhere: Secondary | ICD-10-CM | POA: Diagnosis present

## 2017-02-21 DIAGNOSIS — R079 Chest pain, unspecified: Secondary | ICD-10-CM | POA: Diagnosis not present

## 2017-02-21 DIAGNOSIS — K921 Melena: Secondary | ICD-10-CM | POA: Diagnosis not present

## 2017-02-21 DIAGNOSIS — R945 Abnormal results of liver function studies: Secondary | ICD-10-CM | POA: Diagnosis not present

## 2017-02-21 DIAGNOSIS — S3993XA Unspecified injury of pelvis, initial encounter: Secondary | ICD-10-CM | POA: Diagnosis not present

## 2017-02-21 DIAGNOSIS — F03A0 Unspecified dementia, mild, without behavioral disturbance, psychotic disturbance, mood disturbance, and anxiety: Secondary | ICD-10-CM | POA: Diagnosis present

## 2017-02-21 DIAGNOSIS — R410 Disorientation, unspecified: Secondary | ICD-10-CM | POA: Diagnosis not present

## 2017-02-21 DIAGNOSIS — M79661 Pain in right lower leg: Secondary | ICD-10-CM | POA: Diagnosis not present

## 2017-02-21 DIAGNOSIS — C8372 Burkitt lymphoma, intrathoracic lymph nodes: Secondary | ICD-10-CM | POA: Diagnosis not present

## 2017-02-21 DIAGNOSIS — R946 Abnormal results of thyroid function studies: Secondary | ICD-10-CM | POA: Diagnosis not present

## 2017-02-21 DIAGNOSIS — Z9049 Acquired absence of other specified parts of digestive tract: Secondary | ICD-10-CM | POA: Diagnosis not present

## 2017-02-21 DIAGNOSIS — R748 Abnormal levels of other serum enzymes: Secondary | ICD-10-CM

## 2017-02-21 DIAGNOSIS — D65 Disseminated intravascular coagulation [defibrination syndrome]: Secondary | ICD-10-CM | POA: Diagnosis present

## 2017-02-21 DIAGNOSIS — E039 Hypothyroidism, unspecified: Secondary | ICD-10-CM | POA: Diagnosis present

## 2017-02-21 DIAGNOSIS — R740 Nonspecific elevation of levels of transaminase and lactic acid dehydrogenase [LDH]: Secondary | ICD-10-CM | POA: Diagnosis not present

## 2017-02-21 DIAGNOSIS — I3139 Other pericardial effusion (noninflammatory): Secondary | ICD-10-CM

## 2017-02-21 DIAGNOSIS — I361 Nonrheumatic tricuspid (valve) insufficiency: Secondary | ICD-10-CM | POA: Diagnosis not present

## 2017-02-21 LAB — BASIC METABOLIC PANEL
ANION GAP: 19 — AB (ref 5–15)
BUN: 24 mg/dL — ABNORMAL HIGH (ref 6–20)
CHLORIDE: 96 mmol/L — AB (ref 101–111)
CO2: 21 mmol/L — ABNORMAL LOW (ref 22–32)
Calcium: 8.9 mg/dL (ref 8.9–10.3)
Creatinine, Ser: 1.02 mg/dL — ABNORMAL HIGH (ref 0.44–1.00)
GFR calc non Af Amer: 50 mL/min — ABNORMAL LOW (ref >60)
GFR, EST AFRICAN AMERICAN: 58 mL/min — AB (ref >60)
Glucose, Bld: 88 mg/dL (ref 65–99)
POTASSIUM: 3.5 mmol/L (ref 3.5–5.1)
SODIUM: 136 mmol/L (ref 135–145)

## 2017-02-21 LAB — TSH: TSH: 8.985 u[IU]/mL — ABNORMAL HIGH (ref 0.350–4.500)

## 2017-02-21 LAB — FOLATE: FOLATE: 6.5 ng/mL (ref >5.9)

## 2017-02-21 LAB — IRON AND TIBC
Iron: 133 ug/dL (ref 28–170)
SATURATION RATIOS: 54 % — AB (ref 10.4–31.8)
TIBC: 246 ug/dL — ABNORMAL LOW (ref 250–450)
UIBC: 113 ug/dL

## 2017-02-21 LAB — URINALYSIS, ROUTINE W REFLEX MICROSCOPIC
BILIRUBIN URINE: NEGATIVE
Glucose, UA: NEGATIVE mg/dL
KETONES UR: 5 mg/dL — AB
Nitrite: NEGATIVE
PROTEIN: 100 mg/dL — AB
Specific Gravity, Urine: 1.015 (ref 1.005–1.030)
pH: 6 (ref 5.0–8.0)

## 2017-02-21 LAB — COMPREHENSIVE METABOLIC PANEL
ALBUMIN: 2.9 g/dL — AB (ref 3.5–5.0)
ALT: 53 U/L (ref 14–54)
AST: 355 U/L — AB (ref 15–41)
Alkaline Phosphatase: 330 U/L — ABNORMAL HIGH (ref 38–126)
Anion gap: 23 — ABNORMAL HIGH (ref 5–15)
BUN: 30 mg/dL — AB (ref 6–20)
CHLORIDE: 94 mmol/L — AB (ref 101–111)
CO2: 20 mmol/L — ABNORMAL LOW (ref 22–32)
CREATININE: 1.23 mg/dL — AB (ref 0.44–1.00)
Calcium: 9.4 mg/dL (ref 8.9–10.3)
GFR calc Af Amer: 46 mL/min — ABNORMAL LOW (ref >60)
GFR calc non Af Amer: 40 mL/min — ABNORMAL LOW (ref >60)
GLUCOSE: 96 mg/dL (ref 65–99)
POTASSIUM: 2.8 mmol/L — AB (ref 3.5–5.1)
Sodium: 137 mmol/L (ref 135–145)
Total Bilirubin: 1.3 mg/dL — ABNORMAL HIGH (ref 0.3–1.2)
Total Protein: 5.5 g/dL — ABNORMAL LOW (ref 6.5–8.1)

## 2017-02-21 LAB — MRSA PCR SCREENING: MRSA by PCR: NEGATIVE

## 2017-02-21 LAB — CBC WITH DIFFERENTIAL/PLATELET
BAND NEUTROPHILS: 12 %
BASOS ABS: 0 10*3/uL (ref 0.0–0.1)
BLASTS: 7 %
Basophils Relative: 0 %
EOS ABS: 0.1 10*3/uL (ref 0.0–0.7)
Eosinophils Relative: 1 %
HEMATOCRIT: 27.5 % — AB (ref 36.0–46.0)
Hemoglobin: 9.4 g/dL — ABNORMAL LOW (ref 12.0–15.0)
LYMPHS ABS: 1.7 10*3/uL (ref 0.7–4.0)
Lymphocytes Relative: 12 %
MCH: 29.4 pg (ref 26.0–34.0)
MCHC: 34.2 g/dL (ref 30.0–36.0)
MCV: 85.9 fL (ref 78.0–100.0)
METAMYELOCYTES PCT: 2 %
MONOS PCT: 17 %
MYELOCYTES: 5 %
Monocytes Absolute: 2.3 10*3/uL — ABNORMAL HIGH (ref 0.1–1.0)
NEUTROS ABS: 8.7 10*3/uL — AB (ref 1.7–7.7)
Neutrophils Relative %: 44 %
Other: 0 %
PLATELETS: 33 10*3/uL — AB (ref 150–400)
Promyelocytes Absolute: 0 %
RBC: 3.2 MIL/uL — AB (ref 3.87–5.11)
RDW: 14.9 % (ref 11.5–15.5)
WBC: 13.8 10*3/uL — ABNORMAL HIGH (ref 4.0–10.5)
nRBC: 16 /100 WBC — ABNORMAL HIGH

## 2017-02-21 LAB — RETICULOCYTES
RBC.: 3.04 MIL/uL — AB (ref 3.87–5.11)
Retic Count, Absolute: 57.8 10*3/uL (ref 19.0–186.0)
Retic Ct Pct: 1.9 % (ref 0.4–3.1)

## 2017-02-21 LAB — FERRITIN: Ferritin: 2527 ng/mL — ABNORMAL HIGH (ref 11–307)

## 2017-02-21 LAB — SAVE SMEAR

## 2017-02-21 LAB — PATHOLOGIST SMEAR REVIEW

## 2017-02-21 LAB — DIC (DISSEMINATED INTRAVASCULAR COAGULATION) PANEL
APTT: 29 s (ref 24–36)
FIBRINOGEN: 622 mg/dL — AB (ref 210–475)
PLATELETS: 37 10*3/uL — AB (ref 150–400)
SMEAR REVIEW: NONE SEEN

## 2017-02-21 LAB — VITAMIN B12: Vitamin B-12: 2143 pg/mL — ABNORMAL HIGH (ref 180–914)

## 2017-02-21 LAB — PROCALCITONIN: Procalcitonin: 1.76 ng/mL

## 2017-02-21 LAB — DIC (DISSEMINATED INTRAVASCULAR COAGULATION)PANEL
D-Dimer, Quant: 8.5 ug/mL-FEU — ABNORMAL HIGH (ref 0.00–0.50)
INR: 1.26
Prothrombin Time: 15.8 seconds — ABNORMAL HIGH (ref 11.4–15.2)

## 2017-02-21 LAB — LACTIC ACID, PLASMA
LACTIC ACID, VENOUS: 6.4 mmol/L — AB (ref 0.5–1.9)
Lactic Acid, Venous: 7 mmol/L (ref 0.5–1.9)
Lactic Acid, Venous: 5.9 mmol/L (ref 0.5–1.9)
Lactic Acid, Venous: 5.7 mmol/L (ref 0.5–1.9)

## 2017-02-21 LAB — PROTIME-INR
INR: 1.23
Prothrombin Time: 15.6 seconds — ABNORMAL HIGH (ref 11.4–15.2)

## 2017-02-21 LAB — AMMONIA: Ammonia: 29 umol/L (ref 9–35)

## 2017-02-21 MED ORDER — CEFTRIAXONE SODIUM 1 G IJ SOLR
1.0000 g | Freq: Once | INTRAMUSCULAR | Status: AC
  Administered 2017-02-21: 1 g via INTRAVENOUS
  Filled 2017-02-21: qty 10

## 2017-02-21 MED ORDER — CLONAZEPAM 0.5 MG PO TABS
0.5000 mg | ORAL_TABLET | Freq: Every day | ORAL | Status: DC | PRN
  Administered 2017-02-21 – 2017-02-22 (×2): 1 mg via ORAL
  Filled 2017-02-21 (×2): qty 2

## 2017-02-21 MED ORDER — LACTATED RINGERS IV BOLUS (SEPSIS)
1000.0000 mL | Freq: Once | INTRAVENOUS | Status: AC
  Administered 2017-02-21: 1000 mL via INTRAVENOUS

## 2017-02-21 MED ORDER — IOPAMIDOL (ISOVUE-300) INJECTION 61%
75.0000 mL | Freq: Once | INTRAVENOUS | Status: AC | PRN
  Administered 2017-02-21: 75 mL via INTRAVENOUS

## 2017-02-21 MED ORDER — ONDANSETRON HCL 4 MG PO TABS
4.0000 mg | ORAL_TABLET | Freq: Four times a day (QID) | ORAL | Status: DC | PRN
Start: 2017-02-21 — End: 2017-02-25

## 2017-02-21 MED ORDER — POTASSIUM CHLORIDE CRYS ER 20 MEQ PO TBCR
40.0000 meq | EXTENDED_RELEASE_TABLET | Freq: Once | ORAL | Status: AC
  Administered 2017-02-21: 40 meq via ORAL
  Filled 2017-02-21: qty 2

## 2017-02-21 MED ORDER — MAGNESIUM SULFATE 2 GM/50ML IV SOLN
2.0000 g | Freq: Once | INTRAVENOUS | Status: AC
  Administered 2017-02-21: 2 g via INTRAVENOUS
  Filled 2017-02-21: qty 50

## 2017-02-21 MED ORDER — SODIUM CHLORIDE 0.9 % IV BOLUS (SEPSIS)
1000.0000 mL | Freq: Once | INTRAVENOUS | Status: AC
  Administered 2017-02-21: 1000 mL via INTRAVENOUS

## 2017-02-21 MED ORDER — ONDANSETRON HCL 4 MG/2ML IJ SOLN: 4.0000 mg | Freq: Four times a day (QID) | INTRAMUSCULAR | Status: DC | PRN

## 2017-02-21 MED ORDER — POTASSIUM CHLORIDE 10 MEQ/100ML IV SOLN
10.0000 meq | INTRAVENOUS | Status: AC
  Administered 2017-02-21 (×3): 10 meq via INTRAVENOUS
  Filled 2017-02-21 (×3): qty 100

## 2017-02-21 MED ORDER — ACETAMINOPHEN 650 MG RE SUPP
650.0000 mg | Freq: Four times a day (QID) | RECTAL | Status: DC | PRN
  Administered 2017-02-22: 650 mg via RECTAL
  Filled 2017-02-21: qty 1

## 2017-02-21 MED ORDER — DONEPEZIL HCL 10 MG PO TABS
10.0000 mg | ORAL_TABLET | Freq: Every day | ORAL | Status: DC
  Administered 2017-02-21 – 2017-02-23 (×3): 10 mg via ORAL
  Filled 2017-02-21 (×3): qty 1

## 2017-02-21 MED ORDER — IOPAMIDOL (ISOVUE-300) INJECTION 61%
INTRAVENOUS | Status: AC
  Filled 2017-02-21: qty 75

## 2017-02-21 MED ORDER — DEXTROSE 5 % IV SOLN
2.0000 g | INTRAVENOUS | Status: DC
  Administered 2017-02-22 – 2017-02-23 (×2): 2 g via INTRAVENOUS
  Filled 2017-02-21 (×2): qty 2

## 2017-02-21 MED ORDER — SODIUM CHLORIDE 0.9 % IV SOLN
INTRAVENOUS | Status: DC
  Administered 2017-02-21 – 2017-02-23 (×2): via INTRAVENOUS

## 2017-02-21 MED ORDER — ACETAMINOPHEN 325 MG PO TABS
650.0000 mg | ORAL_TABLET | Freq: Four times a day (QID) | ORAL | Status: DC | PRN
  Administered 2017-02-22: 650 mg via ORAL
  Filled 2017-02-21: qty 2

## 2017-02-21 NOTE — ED Triage Notes (Signed)
Patient sent by [facility] for abnormal labs--elevated LFTs, possible urosepsis. Patient currently on Cipro for UTI but feeling worse instead of beter.

## 2017-02-21 NOTE — ED Notes (Signed)
Date and time results received: 02/19/2017 1615 Test: Lactic Acid Critical Value: 7.0 Name of Provider Notified:Dr. Mesner Orders Received? Or Actions Taken?: N/A

## 2017-02-21 NOTE — Progress Notes (Addendum)
CRITICAL VALUE ALERT  Critical Value:  Lactic Acid 5.9  Date & Time Notied:  03/02/2017 @2040   Provider Notified: 03/04/2017 @ 2042  Orders Received/Actions taken: MD aware. No orders at this time. Will continue to monitor.

## 2017-02-21 NOTE — ED Notes (Signed)
Patient transported to CT 

## 2017-02-21 NOTE — H&P (Signed)
History and Physical    Gail Noble JOI:[id number] DOB: 1933/09/21 DOA: 02/08/2017  PCP: Shirline Frees, MD   Patient coming from: Home.   I have personally briefly reviewed patient's old medical records in [place]  Chief Complaint: feeling tired.   HPI: Gail Noble is a 83 y.o. female with medical history significant of mild dementia, cholecystectomy, hyperlipidemia, who presents to ED refer by PCP due to abnormal labs. Patient was seeing by her PCP on Tuesday due to low energy, at some point she had some dysuria. She was diagnosed with UTI and was started on ciprofloxacin. Patient didn't improved, she went to see her PCP today who refer her to ED for further evaluation.   Patient report feeling weak,  tired, sluggish. Report also possibility of weight lost. Family has notice that patient has develops bruises. Patient report falling the other day and hitting herself with a dresser. She had bruise from that on her right leg.   Of note patient report seeing small amount of blood in the stool, few days ago.   ED Course: Patient was found to have potasium at 2.8, cr at 1.2, alkaline phosphatase 355, bili 1.3, AST 355. WBC at 13, Hb 9.4, platelet count 33. Lactic acid at 6------7. UA with too numerous to count WBC. CT head; no intracranial abnormality. Chest x ray: Soft tissue fullness in the perihilar regions. Adenopathy in the perihilar regions cannot be excluded. Femur x ray; no fracture, tibia; Soft tissue swelling lateral malleolar region. Evidence of old trauma in the lateral malleolar region. No acute fracture or dislocation. No appreciable abnormal periosteal reaction.  Review of Systems: As per HPI otherwise 10 point review of systems negative.    Past Medical History:  Diagnosis Date  . Depression   . Generalized headaches     Past Surgical History:  Procedure Laterality Date  . ABDOMINAL HYSTERECTOMY    . CHOLECYSTECTOMY    . EYE SURGERY    . TOE SURGERY        reports that she has never smoked. She has never used smokeless tobacco. She reports that she does not drink alcohol or use drugs.  No Known Allergies  Family History; Father; alzheimer, had pacemaker. Mother; Died from [place].   Prior to Admission medications   Medication Sig Start Date End Date Taking? Authorizing Provider  acetaminophen (TYLENOL) 500 MG tablet Take 1,000 mg by mouth daily as needed for headache.   Yes [provider]  ciprofloxacin (CIPRO) 500 MG tablet Take 500 mg by mouth 2 (two) times daily.   Yes [provider]  clonazePAM (KLONOPIN) 1 MG tablet Take 0.5-1 mg by mouth daily as needed for anxiety.   Yes [provider]  donepezil (ARICEPT) 10 MG tablet Take 10 mg by mouth at bedtime.   Yes [provider]  ibuprofen (ADVIL,MOTRIN) 200 MG tablet Take 800 mg by mouth 2 (two) times daily as needed for headache or moderate pain.   Yes [provider]  atorvastatin (LIPITOR) 20 MG tablet Take 20 mg by mouth daily.    [provider]  diazepam (VALIUM) 5 MG tablet Take 5 mg by mouth every 6 (six) hours as needed.    [provider]  fish oil-omega-3 fatty acids 1000 MG capsule Take 2 g by mouth daily.    [provider]  sertraline (ZOLOFT) 100 MG tablet Take 100 mg by mouth daily. Take 2 tablets daily    [provider]  Physical Exam: Vitals:   02/10/2017 1500 02/27/2017 1600 02/11/2017 1604 02/08/2017 1700  BP: (!) 118/51 (!) 105/52 (!) 105/52 (!) 111/52  Pulse: 88 84 87 84  Resp: 16 (!) 21 18 16   Temp:   97.8 F (36.6 C)   TempSrc:   Oral   SpO2: 96% 96% 96% 95%  Weight:      Height:        Constitutional: NAD, calm, comfortable Vitals:   03/02/2017 1500 03/04/2017 1600 02/12/2017 1604 02/09/2017 1700  BP: (!) 118/51 (!) 105/52 (!) 105/52 (!) 111/52  Pulse: 88 84 87 84  Resp: 16 (!) 21 18 16   Temp:   97.8 F (36.6 C)   TempSrc:   Oral   SpO2: 96% 96% 96% 95%  Weight:        Height:       Eyes: PERRL, lids and conjunctivae normal ENMT: Mucous membranes are moist. Posterior pharynx clear of any exudate or lesions.Normal dentition.  Neck: normal, supple, no masses, no thyromegaly Respiratory: clear to auscultation bilaterally, no wheezing, no crackles. Normal respiratory effort. No accessory muscle use.  Cardiovascular: Regular rate and rhythm, no murmurs / rubs / gallops. No extremity edema. 2+ pedal pulses. No carotid bruits.  Abdomen: no tenderness, no masses palpated. No hepatosplenomegaly. Bowel sounds positive.  Musculoskeletal: no clubbing / cyanosis. No joint deformity upper and lower extremities. Good ROM, no contractures. Normal muscle tone.  Skin: no rashes, lesions, ulcers. No induration. Multiples bruises upper and lower extremities.  Neurologic: CN 2-12 grossly intact. Sensation intact, DTR normal. Strength 5/5 in all 4.  Psychiatric: Normal judgment and insight. Alert and oriented x 3. Normal mood.     Labs on Admission: I have personally reviewed following labs and imaging studies  CBC:  Recent Labs Lab 02/10/2017 1228  WBC 13.8*  NEUTROABS 8.7*  HGB 9.4*  HCT 27.5*  MCV 85.9  PLT 33*   Basic Metabolic Panel:  Recent Labs Lab 02/08/2017 1228  NA 137  K 2.8*  CL 94*  CO2 20*  GLUCOSE 96  BUN 30*  CREATININE 1.23*  CALCIUM 9.4   GFR: Estimated Creatinine Clearance: 29.7 mL/min (A) (by C-G formula based on SCr of 1.23 mg/dL (H)). Liver Function Tests:  Recent Labs Lab 02/27/2017 1228  AST 355*  ALT 53  ALKPHOS 330*  BILITOT 1.3*  PROT 5.5*  ALBUMIN 2.9*   No results for input(s): LIPASE, AMYLASE in the last 168 hours. No results for input(s): AMMONIA in the last 168 hours. Coagulation Profile:  Recent Labs Lab 02/14/2017 1418  INR 1.23   Cardiac Enzymes: No results for input(s): CKTOTAL, CKMB, CKMBINDEX, TROPONINI in the last 168 hours. BNP (last 3 results) No results for input(s): PROBNP in the last 8760  hours. HbA1C: No results for input(s): HGBA1C in the last 72 hours. CBG: No results for input(s): GLUCAP in the last 168 hours. Lipid Profile: No results for input(s): CHOL, HDL, LDLCALC, TRIG, CHOLHDL, LDLDIRECT in the last 72 hours. Thyroid Function Tests:  Recent Labs  03/03/2017 1228  TSH 8.985*   Anemia Panel:  Recent Labs  02/04/2017 1618  RETICCTPCT 1.9   Urine analysis:    Component Value Date/Time   COLORURINE BROWN (A) 02/20/2017 1210   APPEARANCEUR CLOUDY (A) 03/02/2017 1210   LABSPEC 1.015 02/15/2017 1210   PHURINE 6.0 02/19/2017 1210   GLUCOSEU NEGATIVE 02/07/2017 1210   HGBUR LARGE (A) 03/03/2017 1210   BILIRUBINUR NEGATIVE 02/09/2017 1210   KETONESUR 5 (A) 02/27/2017  1210   PROTEINUR 100 (A) 02/16/2017 1210   NITRITE NEGATIVE  1210   LEUKOCYTESUR SMALL (A) 02/06/2017 1210    Radiological Exams on Admission: Dg Chest 2 View  Result Date: 02/11/2017 CLINICAL DATA:  Pain following fall EXAM: CHEST  2 VIEW COMPARISON:  November 09, 2005. FINDINGS: There is no edema or consolidation. Heart size is normal. Pulmonary vascularity appears normal. There is soft tissue prominence in the perihilar regions bilaterally, an appearance concerning for adenopathy. There is aortic atherosclerosis. There is upper thoracic levoscoliosis. No blastic or lytic bone lesions. IMPRESSION: Soft tissue fullness in the perihilar regions. Adenopathy in the perihilar regions cannot be excluded. Chest CT, ideally with intravenous contrast, is felt to be advisable in this regard. There is no frank edema or consolidation. Heart size within normal limits. There is aortic atherosclerosis. No acute fracture evident. No pneumothorax. Aortic Atherosclerosis (ICD10-I70.0). Electronically Signed   By: Lowella Grip III M.D.   On: 02/17/2017 13:58   Dg Pelvis 1-2 Views  Result Date: 02/08/2017 CLINICAL DATA:  Pain following fall EXAM: PELVIS - 1-2 VIEW COMPARISON:  None. FINDINGS: No fracture  or dislocation. There is mild symmetric narrowing of both hip joints. There is also osteoarthritic change in the pubic symphysis. No erosive change. IMPRESSION: Areas of osteoarthritic change.  No fracture or dislocation evident. Electronically Signed   By: Lowella Grip III M.D.   On: 02/14/2017 13:55   Dg Tibia/fibula Right  Result Date: 02/20/2017 CLINICAL DATA:  Pain following fall EXAM: RIGHT TIBIA AND FIBULA - 2 VIEW COMPARISON:  None. FINDINGS: Frontal and lateral views were obtained. There is evidence of old trauma in the lateral malleolar region. There is soft tissue swelling in the lateral ankle region. No acute fracture or dislocation. No abnormal periosteal reaction. IMPRESSION: Soft tissue swelling lateral malleolar region. Evidence of old trauma in the lateral malleolar region. No acute fracture or dislocation. No appreciable abnormal periosteal reaction. Electronically Signed   By: Lowella Grip III M.D.   On: 02/10/2017 13:54   Ct Head Wo Contrast  Result Date: 02/16/2017 CLINICAL DATA:  83 year old female with weakness.  Recent falls. EXAM: CT HEAD WITHOUT CONTRAST TECHNIQUE: Contiguous axial images were obtained from the base of the skull through the vertex without intravenous contrast. COMPARISON:  Head CT 02/19/2017, brain MRI 01/25/2017. FINDINGS: Brain: Stable cerebral volume. No midline shift, ventriculomegaly, mass effect, evidence of mass lesion, intracranial hemorrhage or evidence of cortically based acute infarction. Stable gray-white matter differentiation throughout the brain, with no encephalomalacia identified. Vascular: Calcified atherosclerosis at the skull base. No suspicious intracranial vascular hyperdensity. Skull: No acute osseous abnormality identified. Sinuses/Orbits: Visualized paranasal sinuses and mastoids are stable and well pneumatized. Other: No acute orbit or scalp soft tissue findings. IMPRESSION: No acute intracranial abnormality. Stable non contrast  CT appearance of the brain. Electronically Signed   By: Genevie Ann M.D.   On:  14:11   Dg Femur Min 2 Views Right  Result Date: 02/22/2017 CLINICAL DATA:  Pain following fall EXAM: RIGHT FEMUR 2 VIEWS COMPARISON:  None. FINDINGS: Frontal and lateral views were obtained. No fracture or dislocation. No appreciable knee joint effusion. No abnormal periosteal reaction. Joint spaces appear unremarkable. IMPRESSION: No fracture or dislocation. No appreciable arthropathy. No knee joint effusion. Electronically Signed   By: Lowella Grip III M.D.   On: 02/15/2017 13:55    EKG: Independently reviewed. Sinus rhythm.    Assessment/Plan Active Problems:   Mild dementia   Sepsis ([place])  Acute lower UTI   Abnormal transaminases   Thrombocytopenia ([facility])  1-Sepsis; UTI Patient presents with hypothermia, SBP in the 100, Leukocytosis, thrombocytopenia. UA with too numerous to count WBC.  Lactic acid at 6--7. She received 2 L IV fluids, IV ceftriaxone. Repeated lactic acid at 7. Will give 2 more L of IV fluids and repeat lactic acid.  If lactic acid continue to increase will need to consult CCM.  IV cefepime per pharmacy.  Blood culture, urine culture.   2-Hypokalemia; received 3 runs IV and oral. Repeat labs this afternoon.   3-Thrombocytopenia, anemia; peripheral smear with leukoerythroblastic reaction. Hematology consulted.  Chest x ray with soft tissue fullness hilar area.  will order CT chest to evaluate for lymphadenopathy.  DIC panel ordered as recommended by oncologist.  Will check anemia panel.   4-Transaminases;  This could be related to sepsis.  Will order RUQ [place].  Hold Lipitor.  Follow trend.   5-Lactic acidosis;  Related to infection.  Patient denies abdominal pain, unlikely bowel ischemia.  IV fluids.   5-blood stool; she report seeing blood stool days ago. Will monitor hb closely.   DVT prophylaxis: SCD Code Status: Full code.  Family Communication: Daughter at  bedside.  Disposition Plan: Admit to step down unit.  Consults called: Oncologist. Discussed case with CCM. Will give IV fluids and repeat lactic acid. If lactic acid continue to increase will need to consult CCM  Admission status: inpatient, step down unit.    Elmarie Shiley MD [facility] Pager [phone]  If 7PM-7AM, please contact night-coverage [email or web address] Password [email or web address]  02/05/2017, 5:29 PM

## 2017-02-21 NOTE — ED Notes (Signed)
Patient transported to X-ray 

## 2017-02-21 NOTE — ED Notes (Signed)
Ultrasound at bedside

## 2017-02-21 NOTE — ED Notes (Signed)
Patient is out of room for Radiology exams. Delay in Ekg & PT/INR.

## 2017-02-21 NOTE — ED Notes (Signed)
Water given per MD Mesner's permission

## 2017-02-21 NOTE — Progress Notes (Signed)
[place]  Telephone:([phone]) [phone] Fax:(336) [phone]     ID: Blue Ruggerio DOB: 24-Jun-1934  MR#: [id number]  XKG#:[id number]  Patient Care Team: Shirline Frees, MD as PCP - General (Family Medicine) Shirline Frees, MD as Consulting Physician (Family Medicine) Chauncey Cruel, MD OTHER MD:  CHIEF COMPLAINT: abnormal blood counts  CURRENT TREATMENT: supportive care, further evaluation   HISTORY OF CURRENT ILLNESS: I do not have CBC results earlier than today as the patient's PCP, Dr Kenton Kingfisher' records do not cross over to [facility]. Per the patient's daughter, Ms Spinney ["Jo"] has been having bruises the family thought might be due to falls, which the patient denies (the patient has mild dementia and may not be a reliable historian). Yesterday the patient was very weak and the family took her to Dr Kenton Kingfisher' office where she was founf to have a UTI and ws started on cipro (I do not have tjhose records).  This morning however the patient felt even weaker. She was directed to the ED where she was found to be anemic and thrombocytopenic, with an elevated WBC. She was admitted for evaluation and tratment of possible sepsis and we were consulted to evaluate the blood count abnormalities  The patient's subsequent history is as detailed below.  INTERVAL HISTORY: I met with the patient in her hospital room and with the patient's daughter, [name], in the hallway  REVIEW OF SYSTEMS: As above. The patient denies dysuria or frequency, but says she has noted blood in her urine on two occasions. She denies h/a, N/V, pain on swallowing or difficulty swallowing, hemoptysis, BRBPR or other overt bleeding.  PAST MEDICAL HISTORY: Past Medical History:  Diagnosis Date  . Depression   . Generalized headaches     PAST SURGICAL HISTORY: Past Surgical History:  Procedure Laterality Date  . ABDOMINAL HYSTERECTOMY    . CHOLECYSTECTOMY    . EYE SURGERY    . TOE SURGERY       FAMILY HISTORY No family history on file.  GYNECOLOGIC HISTORY:  No LMP recorded. Patient has had a hysterectomy.  ADVANCED DIRECTIVES: full code at this point; the patient's daughetr [name] is her [place]: Social History  Substance Use Topics  . Smoking status: Never Smoker  . Smokeless tobacco: Never Used  . Alcohol use No     No Known Allergies  Current Facility-Administered Medications  Medication Dose Route Frequency Provider Last Rate Last Dose  . 0.9 %  sodium chloride infusion   Intravenous Continuous Regalado, Belkys A, MD 125 mL/hr at 02/06/2017 1734    . ceFEPIme (MAXIPIME) 2 g in dextrose 5 % 50 mL IVPB  2 g Intravenous Q24H Pham, Anh P, RPH      . iopamidol (ISOVUE-300) 61 % injection             OBJECTIVE: eldferly White woman examined in bed  Vitals:   02/26/2017 1604 02/19/2017 1700  BP: (!) 105/52 (!) 111/52  Pulse: 87 84  Resp: 18 16  Temp: 97.8 F (36.6 C)      Body mass index is 23.38 kg/m.  Wt Readings from Last 3 Encounters:  02/07/2017 132 lb (59.9 kg)  01/15/17 132 lb (59.9 kg)    Ocular: Sclerae unicteric, pupils round and equal Lungs no rales or rhonchi--auscultated anterolaterally Heart regular rate and rhythm Abd positive bowel sounds; did not palpate spleen tip Neuro: non-focal, pleasantaffect Breasts: deferred   LAB RESULTS:  CMP     Component  Value Date/Time   NA 136 02/08/2017 1652   K 3.5 03/01/2017 1652   CL 96 (L) 02/20/2017 1652   CO2 21 (L) 02/26/2017 1652   GLUCOSE 88 02/06/2017 1652   BUN 24 (H) 02/05/2017 1652   CREATININE 1.02 (H) 02/13/2017 1652   CALCIUM 8.9 02/19/2017 1652   PROT 5.5 (L) 03/03/2017 1228   ALBUMIN 2.9 (L) 02/20/2017 1228   AST 355 (H) 03/04/2017 1228   ALT 53 02/04/2017 1228   ALKPHOS 330 (H) 02/23/2017 1228   BILITOT 1.3 (H) 02/03/2017 1228   GFRNONAA 50 (L) 03/01/2017 1652   GFRAA 58 (L) 02/14/2017 1652    No results found for: TOTALPROTELP, ALBUMINELP, A1GS,  A2GS, BETS, BETA2SER, GAMS, MSPIKE, SPEI  No results found for: KPAFRELGTCHN, LAMBDASER, KAPLAMBRATIO  Lab Results  Component Value Date   WBC 13.8 (H) 02/27/2017   NEUTROABS 8.7 (H) 02/12/2017   HGB 9.4 (L) 02/26/2017   HCT 27.5 (L) 02/26/2017   MCV 85.9 02/14/2017   PLT 37 (L) 02/06/2017    _0 @  No results found for: LABCA2  No components found for: [id number]   Recent Labs Lab 02/05/2017 1618  INR PENDING    Urinalysis    Component Value Date/Time   COLORURINE BROWN (A) 02/03/2017 1210   APPEARANCEUR CLOUDY (A) 02/28/2017 1210   LABSPEC 1.015 02/16/2017 1210   PHURINE 6.0 02/03/2017 1210   GLUCOSEU NEGATIVE 02/07/2017 1210   HGBUR LARGE (A) 02/10/2017 1210   BILIRUBINUR NEGATIVE 02/13/2017 1210   KETONESUR 5 (A) 02/22/2017 1210   PROTEINUR 100 (A) 02/27/2017 1210   NITRITE NEGATIVE 02/08/2017 1210   LEUKOCYTESUR SMALL (A) 02/05/2017 1210     STUDIES: Dg Chest 2 View  Result Date: 02/16/2017 CLINICAL DATA:  Pain following fall EXAM: CHEST  2 VIEW COMPARISON:  November 09, 2005. FINDINGS: There is no edema or consolidation. Heart size is normal. Pulmonary vascularity appears normal. There is soft tissue prominence in the perihilar regions bilaterally, an appearance concerning for adenopathy. There is aortic atherosclerosis. There is upper thoracic levoscoliosis. No blastic or lytic bone lesions. IMPRESSION: Soft tissue fullness in the perihilar regions. Adenopathy in the perihilar regions cannot be excluded. Chest CT, ideally with intravenous contrast, is felt to be advisable in this regard. There is no frank edema or consolidation. Heart size within normal limits. There is aortic atherosclerosis. No acute fracture evident. No pneumothorax. Aortic Atherosclerosis (ICD10-I70.0). Electronically Signed   By: Lowella Grip III M.D.   On: 02/26/2017 13:58   Dg Pelvis 1-2 Views  Result Date: 02/12/2017 CLINICAL DATA:  Pain following fall EXAM: PELVIS - 1-2 VIEW  COMPARISON:  None. FINDINGS: No fracture or dislocation. There is mild symmetric narrowing of both hip joints. There is also osteoarthritic change in the pubic symphysis. No erosive change. IMPRESSION: Areas of osteoarthritic change.  No fracture or dislocation evident. Electronically Signed   By: Lowella Grip III M.D.   On: 02/19/2017 13:55   Dg Tibia/fibula Right  Result Date: 03/02/2017 CLINICAL DATA:  Pain following fall EXAM: RIGHT TIBIA AND FIBULA - 2 VIEW COMPARISON:  None. FINDINGS: Frontal and lateral views were obtained. There is evidence of old trauma in the lateral malleolar region. There is soft tissue swelling in the lateral ankle region. No acute fracture or dislocation. No abnormal periosteal reaction. IMPRESSION: Soft tissue swelling lateral malleolar region. Evidence of old trauma in the lateral malleolar region. No acute fracture or dislocation. No appreciable abnormal periosteal reaction. Electronically Signed   By: Gwyndolyn Saxon  Jasmine December III M.D.   On: 03/02/2017 13:54   Ct Head Wo Contrast  Result Date: 02/06/2017 CLINICAL DATA:  83 year old female with weakness.  Recent falls. EXAM: CT HEAD WITHOUT CONTRAST TECHNIQUE: Contiguous axial images were obtained from the base of the skull through the vertex without intravenous contrast. COMPARISON:  Head CT 02/19/2017, brain MRI 01/25/2017. FINDINGS: Brain: Stable cerebral volume. No midline shift, ventriculomegaly, mass effect, evidence of mass lesion, intracranial hemorrhage or evidence of cortically based acute infarction. Stable gray-white matter differentiation throughout the brain, with no encephalomalacia identified. Vascular: Calcified atherosclerosis at the skull base. No suspicious intracranial vascular hyperdensity. Skull: No acute osseous abnormality identified. Sinuses/Orbits: Visualized paranasal sinuses and mastoids are stable and well pneumatized. Other: No acute orbit or scalp soft tissue findings. IMPRESSION: No acute  intracranial abnormality. Stable non contrast CT appearance of the brain. Electronically Signed   By: Genevie Ann M.D.   On: 02/17/2017 14:11   Ct Head Wo Contrast  Result Date: 02/19/2017 CLINICAL DATA:  Confusion, headaches and dementia. EXAM: CT HEAD WITHOUT CONTRAST TECHNIQUE: Contiguous axial images were obtained from the base of the skull through the vertex without intravenous contrast. COMPARISON:  MRI brain 01/25/2017 FINDINGS: Brain: Stable age related cerebral atrophy, ventriculomegaly and periventricular white matter disease. No extra-axial fluid collections are identified. No CT findings for acute hemispheric infarction or intracranial hemorrhage. No mass lesions. The brainstem and cerebellum are normal. Vascular: Stable vascular calcifications. No hyperdense vessels or definite aneurysm. Skull: No skull fracture bone lesion. Sinuses/Orbits: The paranasal sinuses and mastoid air cells are grossly clear. The globes are intact. Other: No scalp lesions or hematoma. IMPRESSION: 1. Age related cerebral atrophy, ventriculomegaly and periventricular white matter disease. 2. No acute intracranial findings or skull fracture. Electronically Signed   By: Marijo Sanes M.D.   On: 02/19/2017 11:28   Ct Chest W Contrast  Result Date: 02/16/2017 CLINICAL DATA:  Bilateral hilar fullness seen on recent CXR. Weakness and sluggishness with weight loss. EXAM: CT CHEST WITH CONTRAST TECHNIQUE: Multidetector CT imaging of the chest was performed during intravenous contrast administration. CONTRAST:  52m ISOVUE-300 IOPAMIDOL (ISOVUE-300) INJECTION 61% COMPARISON:  Same day CXR FINDINGS: Cardiovascular: There is aortic atherosclerosis without dissection or aneurysm. The ascending aorta measures up to 3.5 cm in caliber. No large central pulmonary embolus is noted. Heart size is top normal coronary arteriosclerosis. Trace pericardial effusion. Mediastinum/Nodes: Bulky mediastinal and bilateral hilar lymphadenopathy, index  lesions are as follows: Subcarinal 2.7 cm short axis, right hilar 2 cm short axis, left hilar 2.1 cm short axis, AP window 2.2 cm short axis, right lower paratracheal 1.5 cm short axis, and left lower paratracheal 1.8 cm short axis. Right supraclavicular and retroclavicular adenopathy is also noted, the largest is supraclavicular measuring 1.3 cm short axis on the right. No thyromegaly or mass. The trachea and mainstem bronchi are patent without occlusion or intraluminal filling defects. The esophagus is not well visualized but there also appears to be a 1.5 cm paraesophageal lymph node along its distal aspect. Lungs/Pleura: There is a small right pleural effusion with adjacent atelectasis. Diffuse interstitial septal thickening is noted with tiny nonspecific subpleural nodular densities in the upper lobes bilaterally and ill-defined nonspecific subpleural right upper lobe pulmonary opacities which may reflect postinfectious or postinflammatory change. There is a more dominant 1 cm left upper lobe masslike opacity, series 7 image 71 which may represent a continuation of the patient's lymphadenopathy. Upper Abdomen: The partly included liver, spleen and adrenal glands demonstrate no acute  appearing abnormalities. There are small retrocrural lymph nodes, the largest approximately 0.8 cm. Musculoskeletal: No chest wall abnormality. No acute or significant osseous findings. Mild midthoracic kyphosis attributable to degenerative disc disease. IMPRESSION: 1. The overarching finding is that of mediastinal, bilateral hilar, supraclavicular, retroclavicular as well as paraesophageal lymphadenopathy raising concern for lymphoma or repeat more remotely metastatic lymphadenopathy. 2. Septal thickening with small right effusion and patchy airspace opacities predominantly in the right upper lobe may reflect stigmata of pulmonary edema. Tiny nonspecific bilateral subpleural nodules are present which may also reflect changes of  lymphoma or potentially metastatic disease. Infectious or inflammatory nodules not entirely excluded. 3. Aortic atherosclerosis. Aortic Atherosclerosis (ICD10-I70.0). Electronically Signed   By: Ashley Royalty M.D.   On: 02/03/2017 18:37   Mr Brain Wo Contrast  Result Date: 01/25/2017 CLINICAL DATA:  Memory loss and depression. EXAM: MRI HEAD WITHOUT CONTRAST TECHNIQUE: Multiplanar, multiecho pulse sequences of the brain and surrounding structures were obtained without intravenous contrast. COMPARISON:  Brain MRI 06/25/2011 FINDINGS: Brain: The midline structures are normal. There is no focal diffusion restriction to indicate acute infarct. There is multifocal hyperintense T2-weighted signal within the periventricular, deep and subcortical white matter, markedly progressed from the prior examination and most often indicative of chronic microvascular ischemia. No intraparenchymal hematoma or chronic microhemorrhage. Brain volume is normal for age without age-advanced or lobar predominant atrophy. The dura is normal and there is no extra-axial collection. Vascular: Major intracranial arterial and venous sinus flow voids are preserved. Skull and upper cervical spine: The visualized skull base, calvarium, upper cervical spine and extracranial soft tissues are normal. Sinuses/Orbits: No fluid levels or advanced mucosal thickening. No mastoid effusion. Normal orbits. IMPRESSION: 1. No acute intracranial abnormality. 2. Worsening white matter disease, most commonly chronic microvascular ischemia. 3. No age advanced or specific lobar predominant volume loss. Electronically Signed   By: Ulyses Jarred M.D.   On: 01/25/2017 14:16   Dg Femur Min 2 Views Right  Result Date: 02/03/2017 CLINICAL DATA:  Pain following fall EXAM: RIGHT FEMUR 2 VIEWS COMPARISON:  None. FINDINGS: Frontal and lateral views were obtained. No fracture or dislocation. No appreciable knee joint effusion. No abnormal periosteal reaction. Joint spaces  appear unremarkable. IMPRESSION: No fracture or dislocation. No appreciable arthropathy. No knee joint effusion. Electronically Signed   By: Lowella Grip III M.D.   On: 02/28/2017 13:55   US Abdomen Limited Ruq  Result Date: 02/13/2017 CLINICAL DATA:  Abnormal transaminases. Unspecified disorder of liver function. Cholecystectomy. EXAM: ULTRASOUND ABDOMEN LIMITED RIGHT UPPER QUADRANT COMPARISON:  None. FINDINGS: Gallbladder: Surgically absent. Common bile duct: Diameter: Normal at 4.9 mm.  No evidence of choledocholithiasis Liver: No focal lesion identified. No biliary dilatation. Hepatopetal flow within the main portal vein. IMPRESSION: 1. Status post cholecystectomy. 2. No ultrasound findings for the patient's elevated transaminase. Electronically Signed   By: Ashley Royalty M.D.   On: 02/14/2017 17:28    ASSESSMENT: 83 y.o. [place] woman admitted with possible sepsis, with a history of unexlained bruising, found to be anemic and thrombocytopenic on admission with a leoukoerythroblastic blod picture  REVIEW OF BLOOD FILM: Shows NRBCs, some very dysplastic looking; left shift, but did not see circulating blasts; no platelet clumps  PLAN: The blood film is suggestive of myelodysplasia ("pre-leukemia") though in those cases the MCV is usually more elevated. Metastatic cancer to the marrow can present the same way.   I think the most efficient way of working up the blood problem is to do a bone  marrow biopsy. I discussed that with the patient's daughter and she is in agreement. I will write for it to be done tomorrow if possible. Otherwise it can be done as outpatient if Denice Paradise is discharged before the test can be done.   I am also writing for flow cytometry from the patient's blood. which certainly can be done in AM and can give [place] preliminary data. However if the bone marrow can be done tomorrow the flow cytometry should be done on the marrow and the test on the blood can be cancelled.  I  will be out of town for the next 4 days but will ask one of my partners to look in on Jo in my absence.    Chauncey Cruel, MD   02/15/2017 6:57 PM [facility] [address] [place], [place] [address] Tel. [phone]    Fax. [phone]

## 2017-02-21 NOTE — ED Notes (Signed)
Date and time results received: 02/03/2017 1329  Test: Lactic Acid  Critical Value: 6.4  Name of Provider Notified: [place]   Orders Received? Or Actions Taken?: None

## 2017-02-21 NOTE — Progress Notes (Signed)
Pharmacy Antibiotic Note  Gail Noble is a 83 y.o. female presented to the ED on 02/13/2017 with elevated AST and alk Phos and suspected urosepsis.  To start cefepime for UTI.  - afeb, wbc 13.8, scr 1.23 (crcl~30), LA 7   Plan: - cefepime 2gm IV q24h  ________________________  Height: '5\' 3"'  (160 cm) Weight: 132 lb (59.9 kg) IBW/kg (Calculated) : 52.4  Temp (24hrs), Avg:97.7 F (36.5 C), Min:97.5 F (36.4 C), Max:97.8 F (36.6 C)   Recent Labs Lab 02/10/2017 1228 02/20/2017 1513  WBC 13.8*  --   CREATININE 1.23*  --   LATICACIDVEN 6.4* 7.0*    Estimated Creatinine Clearance: 29.7 mL/min (A) (by C-G formula based on SCr of 1.23 mg/dL (H)).    No Known Allergies   Thank you for allowing pharmacy to be a part of this patient's care.  Lynelle Doctor 03/03/2017 4:51 PM

## 2017-02-21 NOTE — ED Provider Notes (Signed)
[facility] DEPT Provider Note   CSN: [id number] Arrival date & time: 02/10/2017  1112     History   Chief Complaint Chief Complaint  Patient presents with  . sent by [facility]  . abnormal blood work    HPI Gail Noble is a 83 y.o. female.   Weakness  Primary symptoms include no focal weakness. This is a new problem. The current episode started more than 2 days ago. The problem has been gradually worsening. There was no focality noted. There has been no fever.    Past Medical History:  Diagnosis Date  . Depression   . Generalized headaches     Patient Active Problem List   Diagnosis Date Noted  . Sepsis ([place]) 02/11/2017  . Acute lower UTI 02/23/2017  . Abnormal transaminases 02/06/2017  . Thrombocytopenia ([place]) 02/08/2017  . Mild dementia 01/16/2017    Past Surgical History:  Procedure Laterality Date  . ABDOMINAL HYSTERECTOMY    . CHOLECYSTECTOMY    . EYE SURGERY    . TOE SURGERY      OB History    No data available       Home Medications    Prior to Admission medications   Medication Sig Start Date End Date Taking? Authorizing Provider  acetaminophen (TYLENOL) 500 MG tablet Take 1,000 mg by mouth daily as needed for headache.   Yes [provider]  ciprofloxacin (CIPRO) 500 MG tablet Take 500 mg by mouth 2 (two) times daily.   Yes [provider]  clonazePAM (KLONOPIN) 1 MG tablet Take 0.5-1 mg by mouth daily as needed for anxiety.   Yes [provider]  donepezil (ARICEPT) 10 MG tablet Take 10 mg by mouth at bedtime.   Yes [provider]  ibuprofen (ADVIL,MOTRIN) 200 MG tablet Take 800 mg by mouth 2 (two) times daily as needed for headache or moderate pain.   Yes [provider]  atorvastatin (LIPITOR) 20 MG tablet Take 20 mg by mouth daily.    [provider]  diazepam (VALIUM) 5 MG tablet Take 5 mg by mouth every 6 (six) hours as needed.    [provider]  fish oil-omega-3 fatty  acids 1000 MG capsule Take 2 g by mouth daily.    [provider]  sertraline (ZOLOFT) 100 MG tablet Take 100 mg by mouth daily. Take 2 tablets daily    [provider]    Family History No family history on file.  Social History Social History  Substance Use Topics  . Smoking status: Never Smoker  . Smokeless tobacco: Never Used  . Alcohol use No     Allergies   Patient has no known allergies.   Review of Systems Review of Systems  Neurological: Positive for weakness. Negative for focal weakness.  Hematological: Bruises/bleeds easily.  All other systems reviewed and are negative.    Physical Exam Updated Vital Signs BP (!) 111/52 (BP Location: Left Arm)   Pulse 84   Temp 97.8 F (36.6 C) (Oral)   Resp 16   Ht 5\' 3"  (1.6 m)   Wt 59.9 kg (132 lb)   SpO2 95%   BMI 23.38 kg/m   Physical Exam  Constitutional: She is oriented to person, place, and time. She appears well-developed and well-nourished.  HENT:  Head: Normocephalic and atraumatic.  Eyes: Conjunctivae and EOM are normal.  Neck: Normal range of motion.  Cardiovascular: Normal rate and regular rhythm.   Pulmonary/Chest: Effort normal. No stridor. No respiratory  distress.  Abdominal: Soft. She exhibits no distension.  Musculoskeletal: Normal range of motion. She exhibits no edema or deformity.  Neurological: She is alert and oriented to person, place, and time.  Skin: Skin is warm and dry.  Multiple ecchymotic areas on bilateral legs and hips and arms  Nursing note and vitals reviewed.    ED Treatments / Results  Labs (all labs ordered are listed, but only abnormal results are displayed) Labs Reviewed  CBC WITH DIFFERENTIAL/PLATELET - Abnormal; Notable for the following:       Result Value   WBC 13.8 (*)    RBC 3.20 (*)    Hemoglobin 9.4 (*)    HCT 27.5 (*)    Platelets 33 (*)    nRBC 16 (*)    Neutro Abs 8.7 (*)    Monocytes Absolute 2.3 (*)    All other components within  normal limits  COMPREHENSIVE METABOLIC PANEL - Abnormal; Notable for the following:    Potassium 2.8 (*)    Chloride 94 (*)    CO2 20 (*)    BUN 30 (*)    Creatinine, Ser 1.23 (*)    Total Protein 5.5 (*)    Albumin 2.9 (*)    AST 355 (*)    Alkaline Phosphatase 330 (*)    Total Bilirubin 1.3 (*)    GFR calc non Af Amer 40 (*)    GFR calc Af Amer 46 (*)    Anion gap 23 (*)    All other components within normal limits  TSH - Abnormal; Notable for the following:    TSH 8.985 (*)    All other components within normal limits  URINALYSIS, ROUTINE W REFLEX MICROSCOPIC - Abnormal; Notable for the following:    Color, Urine BROWN (*)    APPearance CLOUDY (*)    Hgb urine dipstick LARGE (*)    Ketones, ur 5 (*)    Protein, ur 100 (*)    Leukocytes, UA SMALL (*)    Bacteria, UA MANY (*)    Squamous Epithelial / LPF 0-5 (*)    All other components within normal limits  LACTIC ACID, PLASMA - Abnormal; Notable for the following:    Lactic Acid, Venous 6.4 (*)    All other components within normal limits  LACTIC ACID, PLASMA - Abnormal; Notable for the following:    Lactic Acid, Venous 7.0 (*)    All other components within normal limits  PROTIME-INR - Abnormal; Notable for the following:    Prothrombin Time 15.6 (*)    All other components within normal limits  RETICULOCYTES - Abnormal; Notable for the following:    RBC. 3.04 (*)    All other components within normal limits  URINE CULTURE  CULTURE, BLOOD (ROUTINE X 2)  CULTURE, BLOOD (ROUTINE X 2)  PATHOLOGIST SMEAR REVIEW  SAVE SMEAR  AMMONIA  VITAMIN B12  FOLATE  IRON AND TIBC  FERRITIN  PROCALCITONIN  BASIC METABOLIC PANEL  DIC (DISSEMINATED INTRAVASCULAR COAGULATION) PANEL  T3, FREE  T4, FREE  LACTIC ACID, PLASMA  LACTIC ACID, PLASMA  POC OCCULT BLOOD, ED    EKG  EKG Interpretation  Date/Time:  Thursday February 21 2017 13:51:38 EDT Ventricular Rate:  87 PR Interval:    QRS Duration: 81 QT Interval:  384 QTC  Calculation: 462 R Axis:   77 Text Interpretation:  Sinus rhythm Low voltage, extremity and precordial leads Baseline wander in lead(s) II III aVF V3 V5 poor tracing, no obvious chagnes since  april 2007 Confirmed by Merrily Pew [phone]) on 02/13/2017 1:59:55 PM Also confirmed by Merrily Pew [phone]), editor Drema Pry [phone])  on 02/27/2017 2:45:26 PM       Radiology Dg Chest 2 View  Result Date: 02/10/2017 CLINICAL DATA:  Pain following fall EXAM: CHEST  2 VIEW COMPARISON:  November 09, 2005. FINDINGS: There is no edema or consolidation. Heart size is normal. Pulmonary vascularity appears normal. There is soft tissue prominence in the perihilar regions bilaterally, an appearance concerning for adenopathy. There is aortic atherosclerosis. There is upper thoracic levoscoliosis. No blastic or lytic bone lesions. IMPRESSION: Soft tissue fullness in the perihilar regions. Adenopathy in the perihilar regions cannot be excluded. Chest CT, ideally with intravenous contrast, is felt to be advisable in this regard. There is no frank edema or consolidation. Heart size within normal limits. There is aortic atherosclerosis. No acute fracture evident. No pneumothorax. Aortic Atherosclerosis (ICD10-I70.0). Electronically Signed   By: Lowella Grip III M.D.   On: 02/16/2017 13:58   Dg Pelvis 1-2 Views  Result Date: 02/15/2017 CLINICAL DATA:  Pain following fall EXAM: PELVIS - 1-2 VIEW COMPARISON:  None. FINDINGS: No fracture or dislocation. There is mild symmetric narrowing of both hip joints. There is also osteoarthritic change in the pubic symphysis. No erosive change. IMPRESSION: Areas of osteoarthritic change.  No fracture or dislocation evident. Electronically Signed   By: Lowella Grip III M.D.   On: 02/08/2017 13:55   Dg Tibia/fibula Right  Result Date: 02/20/2017 CLINICAL DATA:  Pain following fall EXAM: RIGHT TIBIA AND FIBULA - 2 VIEW COMPARISON:  None. FINDINGS: Frontal and lateral views  were obtained. There is evidence of old trauma in the lateral malleolar region. There is soft tissue swelling in the lateral ankle region. No acute fracture or dislocation. No abnormal periosteal reaction. IMPRESSION: Soft tissue swelling lateral malleolar region. Evidence of old trauma in the lateral malleolar region. No acute fracture or dislocation. No appreciable abnormal periosteal reaction. Electronically Signed   By: Lowella Grip III M.D.   On: 02/06/2017 13:54   Ct Head Wo Contrast  Result Date: 02/26/2017 CLINICAL DATA:  83 year old female with weakness.  Recent falls. EXAM: CT HEAD WITHOUT CONTRAST TECHNIQUE: Contiguous axial images were obtained from the base of the skull through the vertex without intravenous contrast. COMPARISON:  Head CT 02/19/2017, brain MRI 01/25/2017. FINDINGS: Brain: Stable cerebral volume. No midline shift, ventriculomegaly, mass effect, evidence of mass lesion, intracranial hemorrhage or evidence of cortically based acute infarction. Stable gray-white matter differentiation throughout the brain, with no encephalomalacia identified. Vascular: Calcified atherosclerosis at the skull base. No suspicious intracranial vascular hyperdensity. Skull: No acute osseous abnormality identified. Sinuses/Orbits: Visualized paranasal sinuses and mastoids are stable and well pneumatized. Other: No acute orbit or scalp soft tissue findings. IMPRESSION: No acute intracranial abnormality. Stable non contrast CT appearance of the brain. Electronically Signed   By: Genevie Ann M.D.   On: 02/28/2017 14:11   Dg Femur Min 2 Views Right  Result Date: 02/27/2017 CLINICAL DATA:  Pain following fall EXAM: RIGHT FEMUR 2 VIEWS COMPARISON:  None. FINDINGS: Frontal and lateral views were obtained. No fracture or dislocation. No appreciable knee joint effusion. No abnormal periosteal reaction. Joint spaces appear unremarkable. IMPRESSION: No fracture or dislocation. No appreciable arthropathy. No knee  joint effusion. Electronically Signed   By: Lowella Grip III M.D.   On: 02/09/2017 13:55   US Abdomen Limited Ruq  Result Date: 02/05/2017 CLINICAL DATA:  Abnormal transaminases. Unspecified disorder of  liver function. Cholecystectomy. EXAM: ULTRASOUND ABDOMEN LIMITED RIGHT UPPER QUADRANT COMPARISON:  None. FINDINGS: Gallbladder: Surgically absent. Common bile duct: Diameter: Normal at 4.9 mm.  No evidence of choledocholithiasis Liver: No focal lesion identified. No biliary dilatation. Hepatopetal flow within the main portal vein. IMPRESSION: 1. Status post cholecystectomy. 2. No ultrasound findings for the patient's elevated transaminase. Electronically Signed   By: Ashley Royalty M.D.   On: 02/04/2017 17:28    Procedures Procedures (including critical care time)  Medications Ordered in ED Medications  potassium chloride 10 mEq in 100 mL IVPB (10 mEq Intravenous New Bag/Given 02/03/2017 1738)  0.9 %  sodium chloride infusion ( Intravenous Rate/Dose Change 02/20/2017 1734)  sodium chloride 0.9 % bolus 1,000 mL (0 mLs Intravenous Stopped 02/03/2017 1734)    And  sodium chloride 0.9 % bolus 1,000 mL (1,000 mLs Intravenous New Bag/Given 03/02/2017 1738)  ceFEPIme (MAXIPIME) 2 g in dextrose 5 % 50 mL IVPB (not administered)  iopamidol (ISOVUE-300) 61 % injection (not administered)  lactated ringers bolus 1,000 mL (0 mLs Intravenous Stopped 02/20/2017 1443)  lactated ringers bolus 1,000 mL (0 mLs Intravenous Stopped 03/03/2017 1626)  cefTRIAXone (ROCEPHIN) 1 g in dextrose 5 % 50 mL IVPB (0 g Intravenous Stopped 02/28/2017 1456)  potassium chloride SA (K-DUR,KLOR-CON) CR tablet 40 mEq (40 mEq Oral Given 02/08/2017 1426)  magnesium sulfate IVPB 2 g 50 mL (0 g Intravenous Stopped 02/15/2017 1613)     Initial Impression / Assessment and Plan / ED Course  I have reviewed the triage vital signs and the nursing notes.  Pertinent labs & imaging results that were available during my care of the patient were reviewed by me  and considered in my medical decision making (see chart for details).   Possibly Sepsis 2/2 UTI with associated DIC? No fractures. High lactic acid concerning. Fluids and antibiotics given. K/Mg ordered for replacement. Also with low platelets of unclear etiology. Will admit to medicine.   Final Clinical Impressions(s) / ED Diagnoses   Final diagnoses:  Thrombocytopenia ([place])  Hypokalemia  Urinary tract infection without hematuria, site unspecified  Lactic acidemia      Ulice Follett, Corene Cornea, MD 02/22/17 [phone]

## 2017-02-22 ENCOUNTER — Other Ambulatory Visit: Payer: Self-pay | Admitting: Family

## 2017-02-22 ENCOUNTER — Encounter (HOSPITAL_COMMUNITY): Payer: Self-pay | Admitting: General Surgery

## 2017-02-22 ENCOUNTER — Inpatient Hospital Stay (HOSPITAL_COMMUNITY): Payer: Medicare HMO

## 2017-02-22 DIAGNOSIS — D696 Thrombocytopenia, unspecified: Secondary | ICD-10-CM

## 2017-02-22 DIAGNOSIS — R7989 Other specified abnormal findings of blood chemistry: Secondary | ICD-10-CM

## 2017-02-22 DIAGNOSIS — A419 Sepsis, unspecified organism: Secondary | ICD-10-CM

## 2017-02-22 LAB — COMPREHENSIVE METABOLIC PANEL
ALBUMIN: 2.7 g/dL — AB (ref 3.5–5.0)
ALK PHOS: 330 U/L — AB (ref 38–126)
ALT: 52 U/L (ref 14–54)
ANION GAP: 19 — AB (ref 5–15)
AST: 332 U/L — ABNORMAL HIGH (ref 15–41)
BUN: 22 mg/dL — ABNORMAL HIGH (ref 6–20)
CALCIUM: 8.8 mg/dL — AB (ref 8.9–10.3)
CHLORIDE: 100 mmol/L — AB (ref 101–111)
CO2: 19 mmol/L — AB (ref 22–32)
Creatinine, Ser: 0.93 mg/dL (ref 0.44–1.00)
GFR calc Af Amer: >60 mL/min (ref >60)
GFR calc non Af Amer: 56 mL/min — ABNORMAL LOW (ref >60)
GLUCOSE: 72 mg/dL (ref 65–99)
POTASSIUM: 4.2 mmol/L (ref 3.5–5.1)
SODIUM: 138 mmol/L (ref 135–145)
Total Bilirubin: 1.4 mg/dL — ABNORMAL HIGH (ref 0.3–1.2)
Total Protein: 5.4 g/dL — ABNORMAL LOW (ref 6.5–8.1)

## 2017-02-22 LAB — LACTIC ACID, PLASMA
LACTIC ACID, VENOUS: 4.4 mmol/L — AB (ref 0.5–1.9)
LACTIC ACID, VENOUS: 4.7 mmol/L — AB (ref 0.5–1.9)

## 2017-02-22 LAB — CBC
HEMATOCRIT: 27.2 % — AB (ref 36.0–46.0)
HEMOGLOBIN: 9.3 g/dL — AB (ref 12.0–15.0)
MCH: 29.4 pg (ref 26.0–34.0)
MCHC: 34.2 g/dL (ref 30.0–36.0)
MCV: 86.1 fL (ref 78.0–100.0)
Platelets: 48 10*3/uL — ABNORMAL LOW (ref 150–400)
RBC: 3.16 MIL/uL — AB (ref 3.87–5.11)
RDW: 15.2 % (ref 11.5–15.5)
WBC: 19.5 10*3/uL — ABNORMAL HIGH (ref 4.0–10.5)

## 2017-02-22 LAB — T4, FREE: FREE T4: 1.26 ng/dL — AB (ref 0.61–1.12)

## 2017-02-22 LAB — OCCULT BLOOD, POC DEVICE: FECAL OCCULT BLD: POSITIVE — AB

## 2017-02-22 MED ORDER — DEXTROSE 5 % IV SOLN
500.0000 mg | INTRAVENOUS | Status: DC
  Administered 2017-02-22 – 2017-02-23 (×2): 500 mg via INTRAVENOUS
  Filled 2017-02-22 (×3): qty 500

## 2017-02-22 MED ORDER — TRAMADOL HCL 50 MG PO TABS
25.0000 mg | ORAL_TABLET | Freq: Three times a day (TID) | ORAL | Status: DC | PRN
  Administered 2017-02-22: 25 mg via ORAL
  Filled 2017-02-22: qty 1

## 2017-02-22 MED ORDER — SODIUM CHLORIDE 0.9 % IV BOLUS (SEPSIS)
500.0000 mL | Freq: Once | INTRAVENOUS | Status: AC
  Administered 2017-02-22: 500 mL via INTRAVENOUS

## 2017-02-22 NOTE — Consult Note (Signed)
Chief Complaint: thrombocytopenia, anemia  Referring Physician:Dr. Lurline Del  Supervising Physician: Corrie Mckusick  Patient Status: [facility] - In-pt  HPI: Gail Noble is a 83 y.o. female who is otherwise is very good health normally, recently developed weakness and fatigue.  She went to her PCP on Tuesday.  She was diagnosed with a UTI.  She went back yesterday as she had not improved.  She was noted to have some lab abnormalities and was referred to the ED for admission.  She has been found to have thrombocytopenia and anemia.  She also had a CT scan of her chest that revealed lymphadenopathy concerning for lymphoma.  HEM/ONC was consulted and has requested a bone marrow biopsy.  Past Medical History:  Past Medical History:  Diagnosis Date  . Depression   . Generalized headaches     Past Surgical History:  Past Surgical History:  Procedure Laterality Date  . ABDOMINAL HYSTERECTOMY    . CHOLECYSTECTOMY    . EYE SURGERY    . TOE SURGERY      Family History: History reviewed. No pertinent family history.  Social History:  reports that she has never smoked. She has never used smokeless tobacco. She reports that she does not drink alcohol or use drugs.  Allergies: No Known Allergies  Medications: Medications reviewed in epic  Please HPI for pertinent positives, otherwise complete 10 system ROS negative.  Mallampati Score: MD Evaluation Airway: WNL Heart: WNL Abdomen: WNL Chest/ Lungs: WNL ASA  Classification: 2 Mallampati/Airway Score: One  Physical Exam: BP (!) 113/36   Pulse 91   Temp 97.9 F (36.6 C) (Oral)   Resp (!) 22   Ht '5\' 3"'  (1.6 m)   Wt 141 lb 1.5 oz (64 kg)   SpO2 98%   BMI 24.99 kg/m  Body mass index is 24.99 kg/m. General: pleasant, WD, WN, elderly white female who is laying in bed in NAD HEENT: head is normocephalic, atraumatic.  Sclera are noninjected.  PERRL.  Ears and nose without any masses or lesions.  Mouth is pink and  moist Heart: regular, rate, and rhythm.  Normal s1,s2. No obvious murmurs, gallops, or rubs noted.  Palpable radial pulses bilaterally Lungs: CTAB, no wheezes, rhonchi, or rales noted.  Respiratory effort nonlabored Abd: soft, NT, ND, +BS, no masses, hernias, or organomegaly Psych: A&Ox3 with an appropriate affect.   Labs: Results for orders placed or performed during the hospital encounter of 02/18/2017 (from the past 48 hour(s))  Urinalysis, Routine w reflex microscopic     Status: Abnormal   Collection Time: 03/01/2017 12:10 PM  Result Value Ref Range   Color, Urine BROWN (A) YELLOW   APPearance CLOUDY (A) CLEAR    Comment: BIOCHEMICALS MAY BE AFFECTED BY COLOR   Specific Gravity, Urine 1.015 1.005 - 1.030   pH 6.0 5.0 - 8.0   Glucose, UA NEGATIVE NEGATIVE mg/dL   Hgb urine dipstick LARGE (A) NEGATIVE   Bilirubin Urine NEGATIVE NEGATIVE   Ketones, ur 5 (A) NEGATIVE mg/dL   Protein, ur 100 (A) NEGATIVE mg/dL   Nitrite NEGATIVE NEGATIVE   Leukocytes, UA SMALL (A) NEGATIVE   RBC / HPF TOO NUMEROUS TO COUNT 0 - 5 RBC/hpf   WBC, UA TOO NUMEROUS TO COUNT 0 - 5 WBC/hpf   Bacteria, UA MANY (A) NONE SEEN   Squamous Epithelial / LPF 0-5 (A) NONE SEEN   Mucous PRESENT    Hyaline Casts, UA PRESENT   CBC with Differential  Status: Abnormal   Collection Time: 02/17/2017 12:28 PM  Result Value Ref Range   WBC 13.8 (H) 4.0 - 10.5 K/uL   RBC 3.20 (L) 3.87 - 5.11 MIL/uL   Hemoglobin 9.4 (L) 12.0 - 15.0 g/dL   HCT 27.5 (L) 36.0 - 46.0 %   MCV 85.9 78.0 - 100.0 fL   MCH 29.4 26.0 - 34.0 pg   MCHC 34.2 30.0 - 36.0 g/dL   RDW 14.9 11.5 - 15.5 %   Platelets 33 (L) 150 - 400 K/uL    Comment: REPEATED TO VERIFY SPECIMEN CHECKED FOR CLOTS PLATELET COUNT CONFIRMED BY SMEAR    Neutrophils Relative % 44 %   Lymphocytes Relative 12 %   Monocytes Relative 17 %   Eosinophils Relative 1 %   Basophils Relative 0 %   Band Neutrophils 12 %   Metamyelocytes Relative 2 %   Myelocytes 5 %    Promyelocytes Absolute 0 %   Blasts 7 %   nRBC 16 (H) 0 /100 WBC   Other 0 %   Neutro Abs 8.7 (H) 1.7 - 7.7 K/uL   Lymphs Abs 1.7 0.7 - 4.0 K/uL   Monocytes Absolute 2.3 (H) 0.1 - 1.0 K/uL   Eosinophils Absolute 0.1 0.0 - 0.7 K/uL   Basophils Absolute 0.0 0.0 - 0.1 K/uL   Smear Review      PATH REVIEW HAS BEEN ORDERED ON THIS ACCESSION NUMBER  Comprehensive metabolic panel     Status: Abnormal   Collection Time: 02/19/2017 12:28 PM  Result Value Ref Range   Sodium 137 135 - 145 mmol/L    Comment: REPEATED TO VERIFY   Potassium 2.8 (L) 3.5 - 5.1 mmol/L    Comment: REPEATED TO VERIFY   Chloride 94 (L) 101 - 111 mmol/L    Comment: REPEATED TO VERIFY   CO2 20 (L) 22 - 32 mmol/L    Comment: REPEATED TO VERIFY   Glucose, Bld 96 65 - 99 mg/dL   BUN 30 (H) 6 - 20 mg/dL   Creatinine, Ser 1.23 (H) 0.44 - 1.00 mg/dL   Calcium 9.4 8.9 - 10.3 mg/dL    Comment: REPEATED TO VERIFY   Total Protein 5.5 (L) 6.5 - 8.1 g/dL   Albumin 2.9 (L) 3.5 - 5.0 g/dL   AST 355 (H) 15 - 41 U/L   ALT 53 14 - 54 U/L   Alkaline Phosphatase 330 (H) 38 - 126 U/L   Total Bilirubin 1.3 (H) 0.3 - 1.2 mg/dL   GFR calc non Af Amer 40 (L) >60 mL/min   GFR calc Af Amer 46 (L) >60 mL/min    Comment: (NOTE) The eGFR has been calculated using the CKD EPI equation. This calculation has not been validated in all clinical situations. eGFR's persistently <60 mL/min signify possible Chronic Kidney Disease.    Anion gap 23 (H) 5 - 15    Comment: REPEATED TO VERIFY  TSH     Status: Abnormal   Collection Time: 02/06/2017 12:28 PM  Result Value Ref Range   TSH 8.985 (H) 0.350 - 4.500 uIU/mL    Comment: Performed by a 3rd Generation assay with a functional sensitivity of <=0.01 uIU/mL.  Lactic acid, plasma     Status: Abnormal   Collection Time: 02/23/2017 12:28 PM  Result Value Ref Range   Lactic Acid, Venous 6.4 (HH) 0.5 - 1.9 mmol/L    Comment: CRITICAL RESULT CALLED TO, READ BACK BY AND VERIFIED WITH: COUSAR,N. RN '@[id number]'   ON  07.19.18 BY COHEN,K   Pathologist smear review     Status: None   Collection Time: 02/11/2017 12:28 PM  Result Value Ref Range   Path Review Reviewed By Violet Baldy, M.D.     Comment: 07.19.18 NORMOCYTIC ANEMIA, LEUKOERYTHROBLASTIC REACTION, THROMBOCYTOPENIA. HEMATOLOGIC EVALUATION IS RECOMMENDED.   Protime-INR     Status: Abnormal   Collection Time: 02/13/2017  2:18 PM  Result Value Ref Range   Prothrombin Time 15.6 (H) 11.4 - 15.2 seconds   INR 1.23   Occult blood, poc device     Status: Abnormal   Collection Time: 03/01/2017  2:39 PM  Result Value Ref Range   Fecal Occult Bld POSITIVE (A) NEGATIVE  Lactic acid, plasma     Status: Abnormal   Collection Time: 03/03/2017  3:13 PM  Result Value Ref Range   Lactic Acid, Venous 7.0 (HH) 0.5 - 1.9 mmol/L    Comment: CRITICAL RESULT CALLED TO, READ BACK BY AND VERIFIED WITH: COUSAR,N. RN '@[id number]'  ON 07.19.18 BY COHEN,K   Culture, blood (routine x 2)     Status: None (Preliminary result)   Collection Time: 03/01/2017  4:17 PM  Result Value Ref Range   Specimen Description BLOOD RIGHT ARM    Special Requests      BOTTLES DRAWN AEROBIC AND ANAEROBIC Blood Culture adequate volume   Culture      NO GROWTH < 12 HOURS Performed at [facility], [place] [address], [place], [place] [address]    Report Status PENDING   Save smear     Status: None   Collection Time: 02/17/2017  4:18 PM  Result Value Ref Range   Smear Review SMEAR STAINED AND AVAILABLE FOR REVIEW   Vitamin B12     Status: Abnormal   Collection Time: 02/14/2017  4:18 PM  Result Value Ref Range   Vitamin B-12 2,143 (H) 180 - 914 pg/mL    Comment: (NOTE) This assay is not validated for testing neonatal or myeloproliferative syndrome specimens for Vitamin B12 levels. Performed at [facility], [place] [address], [place], [place] [address]   Folate     Status: None   Collection Time: 02/23/2017  4:18 PM  Result Value Ref Range   Folate 6.5 >5.9 ng/mL    Comment: Performed  at [facility] [address], [place], [place] [address]  Iron and TIBC     Status: Abnormal   Collection Time: 02/28/2017  4:18 PM  Result Value Ref Range   Iron 133 28 - 170 ug/dL   TIBC 246 (L) 250 - 450 ug/dL   Saturation Ratios 54 (H) 10.4 - 31.8 %   UIBC 113 ug/dL    Comment: Performed at [facility] [address], [place], [place] [address]  Ferritin     Status: Abnormal   Collection Time: 02/22/2017  4:18 PM  Result Value Ref Range   Ferritin 2,527 (H) 11 - 307 ng/mL    Comment: Performed at [facility], [place] [address], [place], [place] [address]  Reticulocytes     Status: Abnormal   Collection Time: 02/16/2017  4:18 PM  Result Value Ref Range   Retic Ct Pct 1.9 0.4 - 3.1 %   RBC. 3.04 (L) 3.87 - 5.11 MIL/uL   Retic Count, Absolute 57.8 19.0 - 186.0 K/uL  Ammonia     Status: None   Collection Time: 02/27/2017  4:18 PM  Result Value Ref Range   Ammonia  29 9 - 35 umol/L  DIC (disseminated intravasc coag) panel     Status: Abnormal   Collection Time: 02/03/2017  4:18 PM  Result Value Ref Range   Prothrombin Time 15.8 (H) 11.4 - 15.2 seconds   INR 1.26    aPTT 29 24 - 36 seconds   Fibrinogen 622 (H) 210 - 475 mg/dL   D-Dimer, Quant 8.50 (H) 0.00 - 0.50 ug/mL-FEU    Comment: (NOTE) At the manufacturer cut-off of 0.50 ug/mL FEU, this assay has been documented to exclude PE with a sensitivity and negative predictive value of 97 to 99%.  At this time, this assay has not been approved by the FDA to exclude DVT/VTE. Results should be correlated with clinical presentation.    Platelets 37 (L) 150 - 400 K/uL    Comment: SPECIMEN CHECKED FOR CLOTS REPEATED TO VERIFY PLATELET COUNT CONFIRMED BY SMEAR    Smear Review NO SCHISTOCYTES SEEN   Culture, blood (routine x 2)     Status: None (Preliminary result)   Collection Time: 03/04/2017  4:51 PM  Result Value Ref Range   Specimen Description BLOOD LEFT ARM    Special Requests      BOTTLES DRAWN AEROBIC AND  ANAEROBIC Blood Culture adequate volume   Culture      NO GROWTH < 12 HOURS Performed at [facility], [place] [address] [place], [place] [address]    Report Status PENDING   Procalcitonin     Status: None   Collection Time: 02/22/2017  4:52 PM  Result Value Ref Range   Procalcitonin 1.76 ng/mL    Comment:        Interpretation: PCT > 0.5 ng/mL and <= 2 ng/mL: Systemic infection (sepsis) is possible, but other conditions are known to elevate PCT as well. (NOTE)         ICU PCT Algorithm               Non ICU PCT Algorithm    ----------------------------     ------------------------------         PCT < 0.25 ng/mL                 PCT < 0.1 ng/mL     Stopping of antibiotics            Stopping of antibiotics       strongly encouraged.               strongly encouraged.    ----------------------------     ------------------------------       PCT level decrease by               PCT < 0.25 ng/mL       >= 80% from peak PCT       OR PCT 0.25 - 0.5 ng/mL          Stopping of antibiotics                                             encouraged.     Stopping of antibiotics           encouraged.    ----------------------------     ------------------------------       PCT level decrease by              PCT >= 0.25 ng/mL       <  80% from peak PCT        AND PCT >= 0.5 ng/mL             Continuing antibiotics                                              encouraged.       Continuing antibiotics            encouraged.    ----------------------------     ------------------------------     PCT level increase compared          PCT > 0.5 ng/mL         with peak PCT AND          PCT >= 0.5 ng/mL             Escalation of antibiotics                                          strongly encouraged.      Escalation of antibiotics        strongly encouraged.   Basic metabolic panel     Status: Abnormal   Collection Time: 02/05/2017  4:52 PM  Result Value Ref Range   Sodium 136 135 - 145 mmol/L    Potassium 3.5 3.5 - 5.1 mmol/L    Comment: DELTA CHECK NOTED NO VISIBLE HEMOLYSIS REPEATED TO VERIFY    Chloride 96 (L) 101 - 111 mmol/L   CO2 21 (L) 22 - 32 mmol/L   Glucose, Bld 88 65 - 99 mg/dL   BUN 24 (H) 6 - 20 mg/dL   Creatinine, Ser 1.02 (H) 0.44 - 1.00 mg/dL   Calcium 8.9 8.9 - 10.3 mg/dL   GFR calc non Af Amer 50 (L) >60 mL/min   GFR calc Af Amer 58 (L) >60 mL/min    Comment: (NOTE) The eGFR has been calculated using the CKD EPI equation. This calculation has not been validated in all clinical situations. eGFR's persistently <60 mL/min signify possible Chronic Kidney Disease.    Anion gap 19 (H) 5 - 15  MRSA PCR Screening     Status: None   Collection Time: 02/16/2017  7:04 PM  Result Value Ref Range   MRSA by PCR NEGATIVE NEGATIVE    Comment:        The GeneXpert MRSA Assay (FDA approved for NASAL specimens only), is one component of a comprehensive MRSA colonization surveillance program. It is not intended to diagnose MRSA infection nor to guide or monitor treatment for MRSA infections.   Lactic acid, plasma     Status: Abnormal   Collection Time: 02/06/2017  7:28 PM  Result Value Ref Range   Lactic Acid, Venous 5.9 (HH) 0.5 - 1.9 mmol/L    Comment: CRITICAL RESULT CALLED TO, READ BACK BY AND VERIFIED WITH: YACOPINO,J RN 7.19.18 '@2039'  ZANDO,C   Lactic acid, plasma     Status: Abnormal   Collection Time: 03/02/2017 10:04 PM  Result Value Ref Range   Lactic Acid, Venous 5.7 (HH) 0.5 - 1.9 mmol/L    Comment: CRITICAL RESULT CALLED TO, READ BACK BY AND VERIFIED WITH: YACOPINO,J RN 7.19.18 '@2313'  ZANDO,C   Comprehensive metabolic panel     Status: Abnormal   Collection Time:  02/22/17  3:10 AM  Result Value Ref Range   Sodium 138 135 - 145 mmol/L   Potassium 4.2 3.5 - 5.1 mmol/L   Chloride 100 (L) 101 - 111 mmol/L   CO2 19 (L) 22 - 32 mmol/L   Glucose, Bld 72 65 - 99 mg/dL   BUN 22 (H) 6 - 20 mg/dL   Creatinine, Ser 0.93 0.44 - 1.00 mg/dL   Calcium 8.8 (L)  8.9 - 10.3 mg/dL   Total Protein 5.4 (L) 6.5 - 8.1 g/dL   Albumin 2.7 (L) 3.5 - 5.0 g/dL   AST 332 (H) 15 - 41 U/L   ALT 52 14 - 54 U/L   Alkaline Phosphatase 330 (H) 38 - 126 U/L   Total Bilirubin 1.4 (H) 0.3 - 1.2 mg/dL   GFR calc non Af Amer 56 (L) >60 mL/min   GFR calc Af Amer >60 >60 mL/min    Comment: (NOTE) The eGFR has been calculated using the CKD EPI equation. This calculation has not been validated in all clinical situations. eGFR's persistently <60 mL/min signify possible Chronic Kidney Disease.    Anion gap 19 (H) 5 - 15  CBC     Status: Abnormal   Collection Time: 02/22/17  3:10 AM  Result Value Ref Range   WBC 19.5 (H) 4.0 - 10.5 K/uL   RBC 3.16 (L) 3.87 - 5.11 MIL/uL   Hemoglobin 9.3 (L) 12.0 - 15.0 g/dL   HCT 27.2 (L) 36.0 - 46.0 %   MCV 86.1 78.0 - 100.0 fL   MCH 29.4 26.0 - 34.0 pg   MCHC 34.2 30.0 - 36.0 g/dL   RDW 15.2 11.5 - 15.5 %   Platelets 48 (L) 150 - 400 K/uL    Comment: REPEATED TO VERIFY SPECIMEN CHECKED FOR CLOTS PLATELET COUNT CONFIRMED BY SMEAR CONSISTENT WITH PREVIOUS RESULT   T4, free     Status: Abnormal   Collection Time: 02/22/17  8:24 AM  Result Value Ref Range   Free T4 1.26 (H) 0.61 - 1.12 ng/dL    Comment: (NOTE) Biotin ingestion may interfere with free T4 tests. If the results are inconsistent with the TSH level, previous test results, or the clinical presentation, then consider biotin interference. If needed, order repeat testing after stopping biotin. Performed at [facility], [place] [address] [place], [place] [address]   Lactic acid, plasma     Status: Abnormal   Collection Time: 02/22/17  8:24 AM  Result Value Ref Range   Lactic Acid, Venous 4.4 (HH) 0.5 - 1.9 mmol/L    Comment: CRITICAL RESULT CALLED TO, READ BACK BY AND VERIFIED WITH: T BANKS,RN '@0921'  02/22/17 MKELLY   Lactic acid, plasma     Status: Abnormal   Collection Time: 02/22/17 10:53 AM  Result Value Ref Range   Lactic Acid, Venous 4.7 (HH) 0.5 -  1.9 mmol/L    Comment: CRITICAL RESULT CALLED TO, READ BACK BY AND VERIFIED WITH: BANKS,T. RN AT 1140 02/22/17 MULLINS,T     Imaging: Dg Chest 2 View  Result Date: 02/10/2017 CLINICAL DATA:  Pain following fall EXAM: CHEST  2 VIEW COMPARISON:  November 09, 2005. FINDINGS: There is no edema or consolidation. Heart size is normal. Pulmonary vascularity appears normal. There is soft tissue prominence in the perihilar regions bilaterally, an appearance concerning for adenopathy. There is aortic atherosclerosis. There is upper thoracic levoscoliosis. No blastic or lytic bone lesions. IMPRESSION: Soft tissue fullness in the perihilar regions. Adenopathy in the perihilar  regions cannot be excluded. Chest CT, ideally with intravenous contrast, is felt to be advisable in this regard. There is no frank edema or consolidation. Heart size within normal limits. There is aortic atherosclerosis. No acute fracture evident. No pneumothorax. Aortic Atherosclerosis (ICD10-I70.0). Electronically Signed   By: Lowella Grip III M.D.   On: 02/27/2017 13:58   Dg Pelvis 1-2 Views  Result Date: 02/09/2017 CLINICAL DATA:  Pain following fall EXAM: PELVIS - 1-2 VIEW COMPARISON:  None. FINDINGS: No fracture or dislocation. There is mild symmetric narrowing of both hip joints. There is also osteoarthritic change in the pubic symphysis. No erosive change. IMPRESSION: Areas of osteoarthritic change.  No fracture or dislocation evident. Electronically Signed   By: Lowella Grip III M.D.   On: 02/20/2017 13:55   Dg Tibia/fibula Right  Result Date: 02/11/2017 CLINICAL DATA:  Pain following fall EXAM: RIGHT TIBIA AND FIBULA - 2 VIEW COMPARISON:  None. FINDINGS: Frontal and lateral views were obtained. There is evidence of old trauma in the lateral malleolar region. There is soft tissue swelling in the lateral ankle region. No acute fracture or dislocation. No abnormal periosteal reaction. IMPRESSION: Soft tissue swelling lateral  malleolar region. Evidence of old trauma in the lateral malleolar region. No acute fracture or dislocation. No appreciable abnormal periosteal reaction. Electronically Signed   By: Lowella Grip III M.D.   On: 02/19/2017 13:54   Ct Head Wo Contrast  Result Date: 02/14/2017 CLINICAL DATA:  83 year old female with weakness.  Recent falls. EXAM: CT HEAD WITHOUT CONTRAST TECHNIQUE: Contiguous axial images were obtained from the base of the skull through the vertex without intravenous contrast. COMPARISON:  Head CT 02/19/2017, brain MRI 01/25/2017. FINDINGS: Brain: Stable cerebral volume. No midline shift, ventriculomegaly, mass effect, evidence of mass lesion, intracranial hemorrhage or evidence of cortically based acute infarction. Stable gray-white matter differentiation throughout the brain, with no encephalomalacia identified. Vascular: Calcified atherosclerosis at the skull base. No suspicious intracranial vascular hyperdensity. Skull: No acute osseous abnormality identified. Sinuses/Orbits: Visualized paranasal sinuses and mastoids are stable and well pneumatized. Other: No acute orbit or scalp soft tissue findings. IMPRESSION: No acute intracranial abnormality. Stable non contrast CT appearance of the brain. Electronically Signed   By: Genevie Ann M.D.   On: 03/02/2017 14:11   Ct Chest W Contrast  Result Date: 02/14/2017 CLINICAL DATA:  Bilateral hilar fullness seen on recent CXR. Weakness and sluggishness with weight loss. EXAM: CT CHEST WITH CONTRAST TECHNIQUE: Multidetector CT imaging of the chest was performed during intravenous contrast administration. CONTRAST:  91m ISOVUE-300 IOPAMIDOL (ISOVUE-300) INJECTION 61% COMPARISON:  Same day CXR FINDINGS: Cardiovascular: There is aortic atherosclerosis without dissection or aneurysm. The ascending aorta measures up to 3.5 cm in caliber. No large central pulmonary embolus is noted. Heart size is top normal coronary arteriosclerosis. Trace pericardial  effusion. Mediastinum/Nodes: Bulky mediastinal and bilateral hilar lymphadenopathy, index lesions are as follows: Subcarinal 2.7 cm short axis, right hilar 2 cm short axis, left hilar 2.1 cm short axis, AP window 2.2 cm short axis, right lower paratracheal 1.5 cm short axis, and left lower paratracheal 1.8 cm short axis. Right supraclavicular and retroclavicular adenopathy is also noted, the largest is supraclavicular measuring 1.3 cm short axis on the right. No thyromegaly or mass. The trachea and mainstem bronchi are patent without occlusion or intraluminal filling defects. The esophagus is not well visualized but there also appears to be a 1.5 cm paraesophageal lymph node along its distal aspect. Lungs/Pleura: There is a small right  pleural effusion with adjacent atelectasis. Diffuse interstitial septal thickening is noted with tiny nonspecific subpleural nodular densities in the upper lobes bilaterally and ill-defined nonspecific subpleural right upper lobe pulmonary opacities which may reflect postinfectious or postinflammatory change. There is a more dominant 1 cm left upper lobe masslike opacity, series 7 image 71 which may represent a continuation of the patient's lymphadenopathy. Upper Abdomen: The partly included liver, spleen and adrenal glands demonstrate no acute appearing abnormalities. There are small retrocrural lymph nodes, the largest approximately 0.8 cm. Musculoskeletal: No chest wall abnormality. No acute or significant osseous findings. Mild midthoracic kyphosis attributable to degenerative disc disease. IMPRESSION: 1. The overarching finding is that of mediastinal, bilateral hilar, supraclavicular, retroclavicular as well as paraesophageal lymphadenopathy raising concern for lymphoma or repeat more remotely metastatic lymphadenopathy. 2. Septal thickening with small right effusion and patchy airspace opacities predominantly in the right upper lobe may reflect stigmata of pulmonary edema. Tiny  nonspecific bilateral subpleural nodules are present which may also reflect changes of lymphoma or potentially metastatic disease. Infectious or inflammatory nodules not entirely excluded. 3. Aortic atherosclerosis. Aortic Atherosclerosis (ICD10-I70.0). Electronically Signed   By: Ashley Royalty M.D.   On: 02/18/2017 18:37   Dg Femur Min 2 Views Right  Result Date: 03/01/2017 CLINICAL DATA:  Pain following fall EXAM: RIGHT FEMUR 2 VIEWS COMPARISON:  None. FINDINGS: Frontal and lateral views were obtained. No fracture or dislocation. No appreciable knee joint effusion. No abnormal periosteal reaction. Joint spaces appear unremarkable. IMPRESSION: No fracture or dislocation. No appreciable arthropathy. No knee joint effusion. Electronically Signed   By: Lowella Grip III M.D.   On: 02/06/2017 13:55   US Abdomen Limited Ruq  Result Date: 02/26/2017 CLINICAL DATA:  Abnormal transaminases. Unspecified disorder of liver function. Cholecystectomy. EXAM: ULTRASOUND ABDOMEN LIMITED RIGHT UPPER QUADRANT COMPARISON:  None. FINDINGS: Gallbladder: Surgically absent. Common bile duct: Diameter: Normal at 4.9 mm.  No evidence of choledocholithiasis Liver: No focal lesion identified. No biliary dilatation. Hepatopetal flow within the main portal vein. IMPRESSION: 1. Status post cholecystectomy. 2. No ultrasound findings for the patient's elevated transaminase. Electronically Signed   By: Ashley Royalty M.D.   On: 02/07/2017 17:28    Assessment/Plan 1. Thrombocytopenia, anemia  We will plan to pursue a BMBX on Monday at 0900.  She will be NPO p MN on Sunday night.  Her family was present during this whole conversation.  They are all agreeable to proceed. Risks and Benefits discussed with the patient including, but not limited to bleeding, infection, damage to adjacent structures or low yield requiring additional tests. All of the patient's questions were answered, patient is agreeable to proceed. Consent signed and in  chart.   Thank you for this interesting consult.  I greatly enjoyed meeting Guillermo Difrancesco and look forward to participating in their care.  A copy of this report was sent to the requesting provider on this date.  Electronically Signed: Henreitta Cea 02/22/2017, 3:30 PM   I spent a total of 40 Minutes    in face to face in clinical consultation, greater than 50% of which was counseling/coordinating care for thrombocytopenia, anemia

## 2017-02-22 NOTE — Progress Notes (Signed)
PROGRESS NOTE    Gail Noble  TKZ:[id number] DOB: 05/29/1934 DOA: 02/16/2017 PCP: Shirline Frees, MD    Brief Narrative: Gail Noble is a 83 y.o. female with medical history significant of mild dementia, cholecystectomy, hyperlipidemia, who presents to ED refer by PCP due to abnormal labs. Patient was seeing by her PCP on Tuesday due to low energy, at some point she had some dysuria. She was diagnosed with UTI and was started on ciprofloxacin. Patient didn't improved, she went to see her PCP today who refer her to ED for further evaluation.   Patient report feeling weak,  tired, sluggish. Report also possibility of weight lost. Family has notice that patient has develops bruises. Patient report falling the other day and hitting herself with a dresser. She had bruise from that on her right leg.   Of note patient report seeing small amount of blood in the stool, few days ago.   ED Course: Patient was found to have potasium at 2.8, cr at 1.2, alkaline phosphatase 355, bili 1.3, AST 355. WBC at 13, Hb 9.4, platelet count 33. Lactic acid at 6------7. UA with too numerous to count WBC. CT head; no intracranial abnormality. Chest x ray: Soft tissue fullness in the perihilar regions. Adenopathy in the perihilar regions cannot be excluded. Femur x ray; no fracture, tibia; Soft tissue swelling lateral malleolar region. Evidence of old trauma in the lateral malleolar region. No acute fracture or dislocation. No appreciable abnormal periosteal reaction.   Assessment & Plan:   Active Problems:   Mild dementia   Sepsis ([place])   Acute lower UTI   Abnormal transaminases   Thrombocytopenia ([facility])  1-Sepsis; UTI Patient presents with hypothermia, SBP in the 100, Leukocytosis, thrombocytopenia. UA with too numerous to count WBC. CT infiltrates upper lobe.  Lactic acid at 6--7--5-4 Sh has received 4.5 L of fluids. Continue with IV fluids.  Continue with cefepime. Will add azithromycin to cover  for atypical PNA infection, due to opacification in the right upper lobe.  Follow up Blood culture, urine culture pending Check ECHO.   2-Hypokalemia; replaced. Resolved.   3-Thrombocytopenia, anemia; peripheral smear with leukoerythroblastic reaction. Hematology consulted.  Chest x ray with soft tissue fullness hilar area.  CT chest with mediastinal, bilateral hilar, supraclavicular, retroclavicular as well as paraesophageal lymphadenopathy raising concern for lymphoma or repeat more remotely metastatic lymphadenopathy. Subpleural nodule.  DIC ; elevated D dimer and elevated fibrinogen.  Anemia panel; B 12; 2.143, folate 6.5, ferritin 2.527, iron 133. Consistent with anemia of chronic diseases.  Platelet increased to 48.  Plan for bone marrow biopsy/ discussed with Dr Marin Olp.    4-Transaminases;  This could be related to sepsis, hematology problems.  RUQ [place]. No liver abnormalities.  Continue to Hold Lipitor.  Follow trend.   5-Lactic acidosis;  Related to infection.  Patient denies abdominal pain, unlikely bowel ischemia.  IV fluids. Lactic acid trending down 7---5.9.  Continue to trend.   5-Episode of Blood stool; she report seeing blood stool days ago. Suspect related to thrombocytopenia.  Hb stable.   6-Elevated D dimer; check doppler.  7-elevated TSH; check free T 3 and free T 4.   DVT prophylaxis: SCD.  Code Status:  Full Code.  Family Communication:none at bedside.  Disposition Plan: remain in the step down unit   Consultants:   Oncology    Procedures: [place]; Status post cholecystectomy. 2. No ultrasound findings for the patient's elevated transaminase.    Antimicrobials:   Cefepime 7-19  Subjective: She is feeling ok, denies abdominal pain   Objective: Vitals:   02/22/17 0000 02/22/17 0012 02/22/17 0358 02/22/17 0500  BP: (!) 113/43     Pulse: 96     Resp: (!) 21     Temp:  98.3 F (36.8 C) 99.2 F (37.3 C)   TempSrc:  Oral Oral   SpO2: 97%      Weight:    64 kg (141 lb 1.5 oz)  Height:        Intake/Output Summary (Last 24 hours) at 02/22/17 0706 Last data filed at 02/22/17 0100  Gross per 24 hour  Intake          1090.42 ml  Output              350 ml  Net           740.42 ml   Filed Weights   02/22/2017 1243 02/22/17 0500  Weight: 59.9 kg (132 lb) 64 kg (141 lb 1.5 oz)    Examination:  General exam: Appears calm and comfortable  Respiratory system: Clear to auscultation. Respiratory effort normal. Cardiovascular system: S1 & S2 heard, RRR. No JVD, murmurs, rubs, gallops or clicks. No pedal edema. Gastrointestinal system: Abdomen is nondistended, soft and nontender. No organomegaly or masses felt. Normal bowel sounds heard. Central nervous system: Alert and oriented. No focal neurological deficits. Extremities: Symmetric 5 x 5 power. Skin: multiples skin bruises.  Psychiatry: Judgement and insight appear normal. Mood & affect appropriate.     Data Reviewed: I have personally reviewed following labs and imaging studies  CBC:  Recent Labs Lab 02/09/2017 1228 03/04/2017 1618 02/22/17 0310  WBC 13.8*  --  19.5*  NEUTROABS 8.7*  --   --   HGB 9.4*  --  9.3*  HCT 27.5*  --  27.2*  MCV 85.9  --  86.1  PLT 33* 37* 48*   Basic Metabolic Panel:  Recent Labs Lab 02/06/2017 1228 02/04/2017 1652 02/22/17 0310  NA 137 136 138  K 2.8* 3.5 4.2  CL 94* 96* 100*  CO2 20* 21* 19*  GLUCOSE 96 88 72  BUN 30* 24* 22*  CREATININE 1.23* 1.02* 0.93  CALCIUM 9.4 8.9 8.8*   GFR: Estimated Creatinine Clearance: 42.7 mL/min (by C-G formula based on SCr of 0.93 mg/dL). Liver Function Tests:  Recent Labs Lab 02/22/2017 1228 02/22/17 0310  AST 355* 332*  ALT 53 52  ALKPHOS 330* 330*  BILITOT 1.3* 1.4*  PROT 5.5* 5.4*  ALBUMIN 2.9* 2.7*   No results for input(s): LIPASE, AMYLASE in the last 168 hours.  Recent Labs Lab 02/03/2017 1618  AMMONIA 29   Coagulation Profile:  Recent Labs Lab 02/11/2017 1418  02/09/2017 1618  INR 1.23 1.26   Cardiac Enzymes: No results for input(s): CKTOTAL, CKMB, CKMBINDEX, TROPONINI in the last 168 hours. BNP (last 3 results) No results for input(s): PROBNP in the last 8760 hours. HbA1C: No results for input(s): HGBA1C in the last 72 hours. CBG: No results for input(s): GLUCAP in the last 168 hours. Lipid Profile: No results for input(s): CHOL, HDL, LDLCALC, TRIG, CHOLHDL, LDLDIRECT in the last 72 hours. Thyroid Function Tests:  Recent Labs  03/05/2017 1228  TSH 8.985*   Anemia Panel:  Recent Labs  02/03/2017 1618  VITAMINB12 2,143*  FOLATE 6.5  FERRITIN 2,527*  TIBC 246*  IRON 133  RETICCTPCT 1.9   Sepsis Labs:  Recent Labs Lab 03/02/2017 1228 02/28/2017 1513 02/22/2017 1652 03/01/2017 1928 02/14/2017 2204  PROCALCITON  --   --  1.76  --   --   LATICACIDVEN 6.4* 7.0*  --  5.9* 5.7*    Recent Results (from the past 240 hour(s))  MRSA PCR Screening     Status: None   Collection Time: 02/03/2017  7:04 PM  Result Value Ref Range Status   MRSA by PCR NEGATIVE NEGATIVE Final    Comment:        The GeneXpert MRSA Assay (FDA approved for NASAL specimens only), is one component of a comprehensive MRSA colonization surveillance program. It is not intended to diagnose MRSA infection nor to guide or monitor treatment for MRSA infections.          Radiology Studies: Dg Chest 2 View  Result Date: 02/27/2017 CLINICAL DATA:  Pain following fall EXAM: CHEST  2 VIEW COMPARISON:  November 09, 2005. FINDINGS: There is no edema or consolidation. Heart size is normal. Pulmonary vascularity appears normal. There is soft tissue prominence in the perihilar regions bilaterally, an appearance concerning for adenopathy. There is aortic atherosclerosis. There is upper thoracic levoscoliosis. No blastic or lytic bone lesions. IMPRESSION: Soft tissue fullness in the perihilar regions. Adenopathy in the perihilar regions cannot be excluded. Chest CT, ideally with  intravenous contrast, is felt to be advisable in this regard. There is no frank edema or consolidation. Heart size within normal limits. There is aortic atherosclerosis. No acute fracture evident. No pneumothorax. Aortic Atherosclerosis (ICD10-I70.0). Electronically Signed   By: Lowella Grip III M.D.   On: 03/02/2017 13:58   Dg Pelvis 1-2 Views  Result Date: 02/09/2017 CLINICAL DATA:  Pain following fall EXAM: PELVIS - 1-2 VIEW COMPARISON:  None. FINDINGS: No fracture or dislocation. There is mild symmetric narrowing of both hip joints. There is also osteoarthritic change in the pubic symphysis. No erosive change. IMPRESSION: Areas of osteoarthritic change.  No fracture or dislocation evident. Electronically Signed   By: Lowella Grip III M.D.   On: 02/07/2017 13:55   Dg Tibia/fibula Right  Result Date: 02/23/2017 CLINICAL DATA:  Pain following fall EXAM: RIGHT TIBIA AND FIBULA - 2 VIEW COMPARISON:  None. FINDINGS: Frontal and lateral views were obtained. There is evidence of old trauma in the lateral malleolar region. There is soft tissue swelling in the lateral ankle region. No acute fracture or dislocation. No abnormal periosteal reaction. IMPRESSION: Soft tissue swelling lateral malleolar region. Evidence of old trauma in the lateral malleolar region. No acute fracture or dislocation. No appreciable abnormal periosteal reaction. Electronically Signed   By: Lowella Grip III M.D.   On: 03/01/2017 13:54   Ct Head Wo Contrast  Result Date: 03/03/2017 CLINICAL DATA:  83 year old female with weakness.  Recent falls. EXAM: CT HEAD WITHOUT CONTRAST TECHNIQUE: Contiguous axial images were obtained from the base of the skull through the vertex without intravenous contrast. COMPARISON:  Head CT 02/19/2017, brain MRI 01/25/2017. FINDINGS: Brain: Stable cerebral volume. No midline shift, ventriculomegaly, mass effect, evidence of mass lesion, intracranial hemorrhage or evidence of cortically based  acute infarction. Stable gray-white matter differentiation throughout the brain, with no encephalomalacia identified. Vascular: Calcified atherosclerosis at the skull base. No suspicious intracranial vascular hyperdensity. Skull: No acute osseous abnormality identified. Sinuses/Orbits: Visualized paranasal sinuses and mastoids are stable and well pneumatized. Other: No acute orbit or scalp soft tissue findings. IMPRESSION: No acute intracranial abnormality. Stable non contrast CT appearance of the brain. Electronically Signed   By: Genevie Ann M.D.   On: 02/20/2017 14:11   Ct Chest  W Contrast  Result Date: 02/15/2017 CLINICAL DATA:  Bilateral hilar fullness seen on recent CXR. Weakness and sluggishness with weight loss. EXAM: CT CHEST WITH CONTRAST TECHNIQUE: Multidetector CT imaging of the chest was performed during intravenous contrast administration. CONTRAST:  62m ISOVUE-300 IOPAMIDOL (ISOVUE-300) INJECTION 61% COMPARISON:  Same day CXR FINDINGS: Cardiovascular: There is aortic atherosclerosis without dissection or aneurysm. The ascending aorta measures up to 3.5 cm in caliber. No large central pulmonary embolus is noted. Heart size is top normal coronary arteriosclerosis. Trace pericardial effusion. Mediastinum/Nodes: Bulky mediastinal and bilateral hilar lymphadenopathy, index lesions are as follows: Subcarinal 2.7 cm short axis, right hilar 2 cm short axis, left hilar 2.1 cm short axis, AP window 2.2 cm short axis, right lower paratracheal 1.5 cm short axis, and left lower paratracheal 1.8 cm short axis. Right supraclavicular and retroclavicular adenopathy is also noted, the largest is supraclavicular measuring 1.3 cm short axis on the right. No thyromegaly or mass. The trachea and mainstem bronchi are patent without occlusion or intraluminal filling defects. The esophagus is not well visualized but there also appears to be a 1.5 cm paraesophageal lymph node along its distal aspect. Lungs/Pleura: There is a  small right pleural effusion with adjacent atelectasis. Diffuse interstitial septal thickening is noted with tiny nonspecific subpleural nodular densities in the upper lobes bilaterally and ill-defined nonspecific subpleural right upper lobe pulmonary opacities which may reflect postinfectious or postinflammatory change. There is a more dominant 1 cm left upper lobe masslike opacity, series 7 image 71 which may represent a continuation of the patient's lymphadenopathy. Upper Abdomen: The partly included liver, spleen and adrenal glands demonstrate no acute appearing abnormalities. There are small retrocrural lymph nodes, the largest approximately 0.8 cm. Musculoskeletal: No chest wall abnormality. No acute or significant osseous findings. Mild midthoracic kyphosis attributable to degenerative disc disease. IMPRESSION: 1. The overarching finding is that of mediastinal, bilateral hilar, supraclavicular, retroclavicular as well as paraesophageal lymphadenopathy raising concern for lymphoma or repeat more remotely metastatic lymphadenopathy. 2. Septal thickening with small right effusion and patchy airspace opacities predominantly in the right upper lobe may reflect stigmata of pulmonary edema. Tiny nonspecific bilateral subpleural nodules are present which may also reflect changes of lymphoma or potentially metastatic disease. Infectious or inflammatory nodules not entirely excluded. 3. Aortic atherosclerosis. Aortic Atherosclerosis (ICD10-I70.0). Electronically Signed   By: DAshley RoyaltyM.D.   On: 02/26/2017 18:37   Dg Femur Min 2 Views Right  Result Date: 02/09/2017 CLINICAL DATA:  Pain following fall EXAM: RIGHT FEMUR 2 VIEWS COMPARISON:  None. FINDINGS: Frontal and lateral views were obtained. No fracture or dislocation. No appreciable knee joint effusion. No abnormal periosteal reaction. Joint spaces appear unremarkable. IMPRESSION: No fracture or dislocation. No appreciable arthropathy. No knee joint effusion.  Electronically Signed   By: WLowella GripIII M.D.   On: 02/15/2017 13:55   [place]domen Limited Ruq  Result Date: 02/03/2017 CLINICAL DATA:  Abnormal transaminases. Unspecified disorder of liver function. Cholecystectomy. EXAM: ULTRASOUND ABDOMEN LIMITED RIGHT UPPER QUADRANT COMPARISON:  None. FINDINGS: Gallbladder: Surgically absent. Common bile duct: Diameter: Normal at 4.9 mm.  No evidence of choledocholithiasis Liver: No focal lesion identified. No biliary dilatation. Hepatopetal flow within the main portal vein. IMPRESSION: 1. Status post cholecystectomy. 2. No ultrasound findings for the patient's elevated transaminase. Electronically Signed   By: DAshley RoyaltyM.D.   On: 02/26/2017 17:28        Scheduled Meds: . donepezil  10 mg Oral QHS   Continuous Infusions: .  sodium chloride 100 mL/hr at 02/22/17 0000  . ceFEPime (MAXIPIME) IV       LOS: 1 day    Time spent: 35 minutes.     Elmarie Shiley, MD [facility] Pager [phone]  If 7PM-7AM, please contact night-coverage [email or web address] Password [email or web address] 02/22/2017, 7:06 AM

## 2017-02-22 NOTE — Care Management Note (Signed)
Case Management Note  Patient Details  Name: Anetta Olvera MRN: [id number] Date of Birth: 1933/10/17  Subjective/Objective:    Urosepsis and confusion                Action/Plan:Pt lives at home with her spouse who has alzheimer's. Daughter states they are interested in home health services. Please provide assistance. Please call daughter [name] at [phone] or son Makailee Nudelman at [phone]. Date:  February 22, 2017 Chart reviewed for concurrent status and case management needs. Will continue to follow patient progress. Discharge Planning: following for needs Expected discharge date: [id number] Velva Harman, BSN, [place], [place]  Expected Discharge Date:   (unknown)               Expected Discharge Plan:  [place]  In-House Referral:     Discharge planning Services  CM Consult  Post Acute Care Choice:    Choice offered to:     DME Arranged:    DME Agency:     HH Arranged:    [place] Agency:     Status of Service:  In process, will continue to follow  If discussed at Long Length of Stay Meetings, dates discussed:    Additional Comments:  Leeroy Cha, RN 02/22/2017, 8:46 AM

## 2017-02-22 NOTE — Progress Notes (Signed)
CRITICAL VALUE ALERT  Critical Value:  Lactic Acid 5.7  Date & Time Notied:  02/08/2017 @ 2330  Provider Notified  Orders Received/Actions taken: 500 ml NS bolus

## 2017-02-22 NOTE — Progress Notes (Signed)
*  Preliminary Results* Bilateral lower extremity venous duplex completed. Bilateral lower extremities are negative for deep vein thrombosis. There is no evidence of Baker's cyst bilaterally.  Incidental finding: there is evidence of multiple heterogenous areas of the right groin, largest measuring 3.6cm. This is suggestive of possible enlarged inguinal lymph nodes.  02/22/2017 3:55 PM Maudry Mayhew, BS, RVT, RDCS, RDMS

## 2017-02-23 ENCOUNTER — Encounter (HOSPITAL_COMMUNITY): Payer: Self-pay | Admitting: Radiology

## 2017-02-23 ENCOUNTER — Inpatient Hospital Stay (HOSPITAL_COMMUNITY): Payer: Medicare HMO

## 2017-02-23 DIAGNOSIS — E872 Acidosis: Secondary | ICD-10-CM

## 2017-02-23 DIAGNOSIS — E876 Hypokalemia: Secondary | ICD-10-CM

## 2017-02-23 DIAGNOSIS — R945 Abnormal results of liver function studies: Secondary | ICD-10-CM

## 2017-02-23 DIAGNOSIS — C8590 Non-Hodgkin lymphoma, unspecified, unspecified site: Principal | ICD-10-CM

## 2017-02-23 DIAGNOSIS — I361 Nonrheumatic tricuspid (valve) insufficiency: Secondary | ICD-10-CM

## 2017-02-23 DIAGNOSIS — F0390 Unspecified dementia without behavioral disturbance: Secondary | ICD-10-CM

## 2017-02-23 DIAGNOSIS — R740 Nonspecific elevation of levels of transaminase and lactic acid dehydrogenase [LDH]: Secondary | ICD-10-CM

## 2017-02-23 DIAGNOSIS — N390 Urinary tract infection, site not specified: Secondary | ICD-10-CM

## 2017-02-23 DIAGNOSIS — R748 Abnormal levels of other serum enzymes: Secondary | ICD-10-CM

## 2017-02-23 LAB — COMPREHENSIVE METABOLIC PANEL
ALK PHOS: 384 U/L — AB (ref 38–126)
ALT: 60 U/L — AB (ref 14–54)
ANION GAP: 19 — AB (ref 5–15)
AST: 374 U/L — ABNORMAL HIGH (ref 15–41)
Albumin: 2.8 g/dL — ABNORMAL LOW (ref 3.5–5.0)
BILIRUBIN TOTAL: 1.8 mg/dL — AB (ref 0.3–1.2)
BUN: 27 mg/dL — ABNORMAL HIGH (ref 6–20)
CALCIUM: 9 mg/dL (ref 8.9–10.3)
CO2: 16 mmol/L — AB (ref 22–32)
CREATININE: 0.97 mg/dL (ref 0.44–1.00)
Chloride: 103 mmol/L (ref 101–111)
GFR, EST NON AFRICAN AMERICAN: 53 mL/min — AB (ref >60)
Glucose, Bld: 83 mg/dL (ref 65–99)
Potassium: 3.7 mmol/L (ref 3.5–5.1)
Sodium: 138 mmol/L (ref 135–145)
TOTAL PROTEIN: 5.4 g/dL — AB (ref 6.5–8.1)

## 2017-02-23 LAB — CBC
HEMATOCRIT: 28 % — AB (ref 36.0–46.0)
HEMOGLOBIN: 9.5 g/dL — AB (ref 12.0–15.0)
MCH: 28.9 pg (ref 26.0–34.0)
MCHC: 33.9 g/dL (ref 30.0–36.0)
MCV: 85.1 fL (ref 78.0–100.0)
Platelets: 32 10*3/uL — ABNORMAL LOW (ref 150–400)
RBC: 3.29 MIL/uL — AB (ref 3.87–5.11)
RDW: 15.7 % — ABNORMAL HIGH (ref 11.5–15.5)
WBC: 16.2 10*3/uL — ABNORMAL HIGH (ref 4.0–10.5)

## 2017-02-23 LAB — SALICYLATE LEVEL

## 2017-02-23 LAB — LACTIC ACID, PLASMA
LACTIC ACID, VENOUS: 6 mmol/L — AB (ref 0.5–1.9)
Lactic Acid, Venous: 6.4 mmol/L (ref 0.5–1.9)
Lactic Acid, Venous: 7.8 mmol/L (ref 0.5–1.9)

## 2017-02-23 LAB — URIC ACID: Uric Acid, Serum: 13.7 mg/dL — ABNORMAL HIGH (ref 2.3–6.6)

## 2017-02-23 LAB — T3, FREE: T3, Free: 1.2 pg/mL — ABNORMAL LOW (ref 2.0–4.4)

## 2017-02-23 LAB — URINE CULTURE: CULTURE: NO GROWTH

## 2017-02-23 LAB — ECHOCARDIOGRAM COMPLETE
HEIGHTINCHES: 63 in
Weight: 2328.06 oz

## 2017-02-23 LAB — LACTATE DEHYDROGENASE

## 2017-02-23 MED ORDER — IOPAMIDOL (ISOVUE-300) INJECTION 61%
100.0000 mL | Freq: Once | INTRAVENOUS | Status: AC | PRN
  Administered 2017-02-23: 100 mL via INTRAVENOUS

## 2017-02-23 MED ORDER — SODIUM CHLORIDE 0.9 % IV BOLUS (SEPSIS)
1000.0000 mL | Freq: Once | INTRAVENOUS | Status: AC
Start: 2017-02-23 — End: 2017-02-23
  Administered 2017-02-23: 1000 mL via INTRAVENOUS

## 2017-02-23 MED ORDER — LEVOTHYROXINE SODIUM 25 MCG PO TABS
25.0000 ug | ORAL_TABLET | Freq: Every day | ORAL | Status: DC
  Filled 2017-02-23: qty 1

## 2017-02-23 MED ORDER — IOPAMIDOL (ISOVUE-300) INJECTION 61%
INTRAVENOUS | Status: AC
  Filled 2017-02-23: qty 30

## 2017-02-23 MED ORDER — METRONIDAZOLE IN NACL 5-0.79 MG/ML-% IV SOLN
500.0000 mg | Freq: Three times a day (TID) | INTRAVENOUS | Status: DC
  Administered 2017-02-23 (×2): 500 mg via INTRAVENOUS
  Filled 2017-02-23 (×3): qty 100

## 2017-02-23 MED ORDER — VANCOMYCIN HCL IN DEXTROSE 1-5 GM/200ML-% IV SOLN
1000.0000 mg | Freq: Once | INTRAVENOUS | Status: AC
  Administered 2017-02-23: 1000 mg via INTRAVENOUS
  Filled 2017-02-23: qty 200

## 2017-02-23 MED ORDER — SODIUM BICARBONATE 8.4 % IV SOLN
INTRAVENOUS | Status: DC
  Administered 2017-02-23: 09:00:00 via INTRAVENOUS
  Filled 2017-02-23: qty 150

## 2017-02-23 MED ORDER — SODIUM BICARBONATE 650 MG PO TABS: 650.0000 mg | ORAL_TABLET | Freq: Two times a day (BID) | ORAL | Status: DC

## 2017-02-23 MED ORDER — SODIUM CHLORIDE 0.9 % IV SOLN
INTRAVENOUS | Status: DC
  Administered 2017-02-23: 12:00:00 via INTRAVENOUS

## 2017-02-23 MED ORDER — IOPAMIDOL (ISOVUE-300) INJECTION 61%
INTRAVENOUS | Status: AC
  Filled 2017-02-23: qty 100

## 2017-02-23 MED ORDER — METOPROLOL TARTRATE 5 MG/5ML IV SOLN: 2.5000 mg | Freq: Three times a day (TID) | INTRAVENOUS | Status: DC | PRN

## 2017-02-23 MED ORDER — SODIUM CHLORIDE 0.9 % IV BOLUS (SEPSIS)
1000.0000 mL | Freq: Once | INTRAVENOUS | Status: AC
  Administered 2017-02-23: 1000 mL via INTRAVENOUS

## 2017-02-23 MED ORDER — IOPAMIDOL (ISOVUE-300) INJECTION 61%
15.0000 mL | Freq: Once | INTRAVENOUS | Status: DC | PRN
  Administered 2017-02-23: 30 mL via ORAL
  Filled 2017-02-23: qty 30

## 2017-02-23 MED ORDER — SODIUM CHLORIDE 0.9 % IV SOLN: 500.0000 mg | Freq: Two times a day (BID) | INTRAVENOUS | Status: DC

## 2017-02-23 MED ORDER — SODIUM CHLORIDE 0.9 % IV BOLUS (SEPSIS)
500.0000 mL | Freq: Once | INTRAVENOUS | Status: AC
  Administered 2017-02-23: 500 mL via INTRAVENOUS

## 2017-02-23 NOTE — Progress Notes (Signed)
Gail Noble is a very nice. She is quite anxious. This I can understand.  It looks like she has a probable lymphoproliferative process. She has lymphadenopathy on CT of the chest.  She has a markedly elevated lactic acid. I would check her LDH.  I'm not sure why her liver function test would be elevated. I think CT of the abdomen would be a good idea. She had ultrasound of the abdomen. This was pretty much unremarkable.  There is no positive cultures. She is on antibiotics. Her blood pressure has been doing okay.  We had a long talk. She asked me to pray for her. I definitely did that.  I told her that we still are not sure what is going on. She got very nervous and worried when I told her that I was a "blood and cancer doctor." I told her that we don't know if she has cancer yet. However, she began to worry that she was not going to leave the hospital. She is very worried that she will not take care of her husband who has Alzheimer's. She misses her little dachshund.  I told her that we have to do the necessary tests before we know if she has "cancer".  By the CT scan of the chest, I would have to think that this is some form of lymphoma.  She is not eating all that much. She's had no vomiting. She's had no diarrhea.  Her platelet count has gone down further. Her platelet count is 32,000. This might be reflective of medications. It might be indicative of an underlying bone marrow infiltrative process. I'm not sure if a blood smear has been made. We really need to get one made. She has an element of anemia.  Hopefully, a bone marrow test will be done on Monday.  On her physical exam, I cannot palpate any lymph nodes. I cannot palpate her spleen.  Again, Mrs. Davisson is a very nice. I have to believe that she has some form of lymphoma. Ultimately, she is going to need a biopsy. Possibly, the bone marrow biopsy might show [place] something. However, I would think that getting an actual lymph node  biopsy is going to be necessary.  We'll have to watch her platelet count closely.  If her cultures are still negative in a day or so, I would pull back on antibiotics.  Again, we had a very good prayer session. She wanted me to pray with her. I definitely did this.  Lattie Haw, MD  2 Timothy 1:7

## 2017-02-23 NOTE — Progress Notes (Signed)
CRITICAL VALUE ALERT  Critical Value:  Lactic acid 7.8  Date & Time Notied:  02/23/2017 2005  Provider Notified: [place]  Orders Received/Actions taken: normal saline 536ml bolus ordered and given.

## 2017-02-23 NOTE — Progress Notes (Signed)
Pharmacy Antibiotic Note  Gail Noble is a 83 y.o. female presented to the ED on 02/17/2017 with elevated AST and alk Phos and suspected urosepsis.  To start cefepime for UTI.   - Afebrile, WBC remains elevated, SCr improved, CrCl ~ 42 - LA:  7 > 5.7 > 4.4 > 6.4 - Start vancomycin.   Plan: - Cont Cefepime 2g q24h - Start Vanc 1g x 1 then 527m q12h - Vancomycin IV qh. - Measure Vanc trough at steady state. - Follow up renal fxn, culture results, and clinical course.   ________________________  Height: '5\' 3"'  (160 cm) Weight: 145 lb 8.1 oz (66 kg) IBW/kg (Calculated) : 52.4  Temp (24hrs), Avg:98.4 F (36.9 C), Min:97.9 F (36.6 C), Max:98.9 F (37.2 C)   Recent Labs Lab 02/06/2017 1228  03/05/2017 1652 02/23/2017 1928 02/19/2017 2204 02/22/17 0310 02/22/17 0824 02/22/17 1053 02/23/17 0320 02/23/17 0321  WBC 13.8*  --   --   --   --  19.5*  --   --  16.2*  --   CREATININE 1.23*  --  1.02*  --   --  0.93  --   --  0.97  --   LATICACIDVEN 6.4*  < >  --  5.9* 5.7*  --  4.4* 4.7*  --  6.4*  < > = values in this interval not displayed.  Estimated Creatinine Clearance: 41.5 mL/min (by C-G formula based on SCr of 0.97 mg/dL).    No Known Allergies   Antimicrobials this admission:  7/19 CTX x1 7/19 cefepime>> 7/20 azithromycin >> 7/21 vanc >>   Dose adjustments this admission:    Microbiology results:  7/19 BCx: ngtd 7/19 UCx: sent  7/19 UA: many bacteria, small leuk 7/19 MRSA PCR: negative   Thank you for allowing pharmacy to be a part of this patient's care.  TRomeo Rabon PharmD, pager [phone] 02/23/2017,7:14 AM.

## 2017-02-23 NOTE — Progress Notes (Signed)
Notified Regaldo MD at bedside at 0725 of critical lab lactic acid 6.0, Billie from lab called to notify at [phone].  Lab collected at 0600.  Regaldo MD ordered one additional lactic acid for 1030 today and LDH.  CRITICAL VALUE ALERT  Critical Value:  Lactic 6.0 0600  Date & Time Notied:  02/23/17 0315  Provider Notified: Dr. Tyrell Antonio at 0725 (bedside, visiting pt)  Orders Received/Actions taken: Vancomycin order, fluid bolus, repeat lactic acid and LDH labs for 1030

## 2017-02-23 NOTE — Progress Notes (Addendum)
PROGRESS NOTE    Gail Noble  YBW:[id number] DOB: Jun 29, 1934 DOA: 02/13/2017 PCP: Shirline Frees, MD    Brief Narrative: Gail Noble is a 83 y.o. female with medical history significant of mild dementia, cholecystectomy, hyperlipidemia, who presents to ED refer by PCP due to abnormal labs. Patient was seeing by her PCP on Tuesday due to low energy, at some point she had some dysuria. She was diagnosed with UTI and was started on ciprofloxacin. Patient didn't improved, she went to see her PCP today who refer her to ED for further evaluation.   Patient report feeling weak,  tired, sluggish. Report also possibility of weight lost. Family has notice that patient has develops bruises. Patient report falling the other day and hitting herself with a dresser. She had bruise from that on her right leg.   Of note patient report seeing small amount of blood in the stool, few days ago.   ED Course: Patient was found to have potasium at 2.8, cr at 1.2, alkaline phosphatase 355, bili 1.3, AST 355. WBC at 13, Hb 9.4, platelet count 33. Lactic acid at 6------7. UA with too numerous to count WBC. CT head; no intracranial abnormality. Chest x ray: Soft tissue fullness in the perihilar regions. Adenopathy in the perihilar regions cannot be excluded. Femur x ray; no fracture, tibia; Soft tissue swelling lateral malleolar region. Evidence of old trauma in the lateral malleolar region. No acute fracture or dislocation. No appreciable abnormal periosteal reaction.   Assessment & Plan:   Active Problems:   Mild dementia   Sepsis ([place])   Acute lower UTI   Abnormal transaminases   Thrombocytopenia ([facility])  1-Sepsis; UTI Patient presents with hypothermia, SBP in the 100, Leukocytosis, thrombocytopenia. UA with too numerous to count WBC. CT infiltrates upper lobe.  Lactic acid at 6--7--5-4---6 Sh has received 5.5 L of fluids. Continue with IV fluids.  Continue with cefepime, azithromycin. will add  vancomycin.  Follow up Blood culture no growth, urine culture pending Check ECHO.  Lactic acid still elevated. Will get CT abdomen. Check LDH. Will consult CCM./ Metabolic acidosis; start bicarb gtt.   2-Hypokalemia; Resolved.   3-Thrombocytopenia, anemia; peripheral smear with leukoerythroblastic reaction. Hematology consulted.  Chest x ray with soft tissue fullness hilar area.  CT chest with mediastinal, bilateral hilar, supraclavicular, retroclavicular as well as paraesophageal lymphadenopathy raising concern for lymphoma or repeat more remotely metastatic lymphadenopathy. Subpleural nodule.  DIC ; elevated D dimer and elevated fibrinogen.  Anemia panel; B 12; 2.143, folate 6.5, ferritin 2.527, iron 133. Consistent with anemia of chronic diseases.  Plan for bone marrow biopsy/ discussed with Dr Marin Olp.  Platelet decreased to 32  4-Transaminases;  This could be related to sepsis.  RUQ [place]. No liver abnormalities.  Continue to Hold Lipitor.  Follow trend.  Check hepatitis panel.   5-Lactic acidosis;  Related to infection ? Patient denies abdominal pain, unlikely bowel ischemia.  IV fluids. Lactic acid trending down 7---5.9. --6 Continue to trend.  Check LDH, CT abdomen pelvis.   5-Episode of Blood stool; she report seeing blood stool days ago. Suspect related to thrombocytopenia.  Hb stable.   6-Elevated D dimer; doppler negative 7-Elevated TSH; T 3 and free T 4 mildly elevated. Needs repeat labs. Marland Kitchen   8-Moderate pleural effusion;  Discussed with cardiology, they will arrange doppler MV and cardio will follow up in consultation   DVT prophylaxis: SCD.  Code Status:  Full Code.  Family Communication: daughter in law 7-20 Disposition Plan:  remain in the step down unit   Consultants:   Oncology    Procedures: [place]; Status post cholecystectomy. 2. No ultrasound findings for the patient's elevated transaminase.    Antimicrobials:   Cefepime  7-19   Subjective: She is alert in no distress. She denies abdominal pain, cough, dyspnea.   Objective: Vitals:   02/23/17 0000 02/23/17 0313 02/23/17 0325 02/23/17 0330  BP:    (!) 140/47  Pulse:      Resp:    (!) 26  Temp: 98 F (36.7 C) 98.3 F (36.8 C)    TempSrc:  Oral    SpO2:      Weight:   66 kg (145 lb 8.1 oz)   Height:        Intake/Output Summary (Last 24 hours) at 02/23/17 0736 Last data filed at 02/23/17 0453  Gross per 24 hour  Intake          2505.42 ml  Output              450 ml  Net          2055.42 ml   Filed Weights   02/22/2017 1243 02/22/17 0500 02/23/17 0325  Weight: 59.9 kg (132 lb) 64 kg (141 lb 1.5 oz) 66 kg (145 lb 8.1 oz)    Examination:  General exam: NAD Respiratory system: CTA Cardiovascular system: S 1, S 2 RRR Gastrointestinal system; BS present, soft, nt Central nervous system: Alert , non focal.  Extremities: Symmetric 5 x 5 power. Skin: multiples skin bruises.      Data Reviewed: I have personally reviewed following labs and imaging studies  CBC:  Recent Labs Lab 02/03/2017 1228 02/27/2017 1618 02/22/17 0310 02/23/17 0320  WBC 13.8*  --  19.5* 16.2*  NEUTROABS 8.7*  --   --   --   HGB 9.4*  --  9.3* 9.5*  HCT 27.5*  --  27.2* 28.0*  MCV 85.9  --  86.1 85.1  PLT 33* 37* 48* 32*   Basic Metabolic Panel:  Recent Labs Lab 02/23/2017 1228 02/28/2017 1652 02/22/17 0310 02/23/17 0320  NA 137 136 138 138  K 2.8* 3.5 4.2 3.7  CL 94* 96* 100* 103  CO2 20* 21* 19* 16*  GLUCOSE 96 88 72 83  BUN 30* 24* 22* 27*  CREATININE 1.23* 1.02* 0.93 0.97  CALCIUM 9.4 8.9 8.8* 9.0   GFR: Estimated Creatinine Clearance: 41.5 mL/min (by C-G formula based on SCr of 0.97 mg/dL). Liver Function Tests:  Recent Labs Lab 02/08/2017 1228 02/22/17 0310 02/23/17 0320  AST 355* 332* 374*  ALT 53 52 60*  ALKPHOS 330* 330* 384*  BILITOT 1.3* 1.4* 1.8*  PROT 5.5* 5.4* 5.4*  ALBUMIN 2.9* 2.7* 2.8*   No results for input(s): LIPASE,  AMYLASE in the last 168 hours.  Recent Labs Lab 02/20/2017 1618  AMMONIA 29   Coagulation Profile:  Recent Labs Lab 02/04/2017 1418 02/12/2017 1618  INR 1.23 1.26   Cardiac Enzymes: No results for input(s): CKTOTAL, CKMB, CKMBINDEX, TROPONINI in the last 168 hours. BNP (last 3 results) No results for input(s): PROBNP in the last 8760 hours. HbA1C: No results for input(s): HGBA1C in the last 72 hours. CBG: No results for input(s): GLUCAP in the last 168 hours. Lipid Profile: No results for input(s): CHOL, HDL, LDLCALC, TRIG, CHOLHDL, LDLDIRECT in the last 72 hours. Thyroid Function Tests:  Recent Labs  02/03/2017 1228 02/22/17 0824  TSH 8.985*  --   FREET4  --  1.26*  T3FREE  --  1.2*   Anemia Panel:  Recent Labs  02/18/2017 1618  VITAMINB12 2,143*  FOLATE 6.5  FERRITIN 2,527*  TIBC 246*  IRON 133  RETICCTPCT 1.9   Sepsis Labs:  Recent Labs Lab 02/07/2017 1652  02/22/17 0824 02/22/17 1053 02/23/17 0321 02/23/17 0626  PROCALCITON 1.76  --   --   --   --   --   LATICACIDVEN  --   < > 4.4* 4.7* 6.4* 6.0*  < > = values in this interval not displayed.  Recent Results (from the past 240 hour(s))  Culture, blood (routine x 2)     Status: None (Preliminary result)   Collection Time: 02/19/2017  4:17 PM  Result Value Ref Range Status   Specimen Description BLOOD RIGHT ARM  Final   Special Requests   Final    BOTTLES DRAWN AEROBIC AND ANAEROBIC Blood Culture adequate volume   Culture   Final    NO GROWTH < 12 HOURS Performed at [facility], [address] [place], [place] [address]    Report Status PENDING  Incomplete  Culture, blood (routine x 2)     Status: None (Preliminary result)   Collection Time: 02/16/2017  4:51 PM  Result Value Ref Range Status   Specimen Description BLOOD LEFT ARM  Final   Special Requests   Final    BOTTLES DRAWN AEROBIC AND ANAEROBIC Blood Culture adequate volume   Culture   Final    NO GROWTH < 12 HOURS Performed at [facility], [place] [address], [place], [place] [address]    Report Status PENDING  Incomplete  MRSA PCR Screening     Status: None   Collection Time: 03/03/2017  7:04 PM  Result Value Ref Range Status   MRSA by PCR NEGATIVE NEGATIVE Final    Comment:        The GeneXpert MRSA Assay (FDA approved for NASAL specimens only), is one component of a comprehensive MRSA colonization surveillance program. It is not intended to diagnose MRSA infection nor to guide or monitor treatment for MRSA infections.          Radiology Studies: Dg Chest 2 View  Result Date: 02/18/2017 CLINICAL DATA:  Pain following fall EXAM: CHEST  2 VIEW COMPARISON:  November 09, 2005. FINDINGS: There is no edema or consolidation. Heart size is normal. Pulmonary vascularity appears normal. There is soft tissue prominence in the perihilar regions bilaterally, an appearance concerning for adenopathy. There is aortic atherosclerosis. There is upper thoracic levoscoliosis. No blastic or lytic bone lesions. IMPRESSION: Soft tissue fullness in the perihilar regions. Adenopathy in the perihilar regions cannot be excluded. Chest CT, ideally with intravenous contrast, is felt to be advisable in this regard. There is no frank edema or consolidation. Heart size within normal limits. There is aortic atherosclerosis. No acute fracture evident. No pneumothorax. Aortic Atherosclerosis (ICD10-I70.0). Electronically Signed   By: Lowella Grip III M.D.   On: 03/05/2017 13:58   Dg Pelvis 1-2 Views  Result Date: 02/20/2017 CLINICAL DATA:  Pain following fall EXAM: PELVIS - 1-2 VIEW COMPARISON:  None. FINDINGS: No fracture or dislocation. There is mild symmetric narrowing of both hip joints. There is also osteoarthritic change in the pubic symphysis. No erosive change. IMPRESSION: Areas of osteoarthritic change.  No fracture or dislocation evident. Electronically Signed   By: Lowella Grip III M.D.   On: 02/06/2017 13:55   Dg Tibia/fibula  Right  Result Date:  02/20/2017 CLINICAL DATA:  Pain following fall EXAM: RIGHT TIBIA AND FIBULA - 2 VIEW COMPARISON:  None. FINDINGS: Frontal and lateral views were obtained. There is evidence of old trauma in the lateral malleolar region. There is soft tissue swelling in the lateral ankle region. No acute fracture or dislocation. No abnormal periosteal reaction. IMPRESSION: Soft tissue swelling lateral malleolar region. Evidence of old trauma in the lateral malleolar region. No acute fracture or dislocation. No appreciable abnormal periosteal reaction. Electronically Signed   By: Lowella Grip III M.D.   On: 02/15/2017 13:54   Ct Head Wo Contrast  Result Date: 02/23/2017 CLINICAL DATA:  83 year old female with weakness.  Recent falls. EXAM: CT HEAD WITHOUT CONTRAST TECHNIQUE: Contiguous axial images were obtained from the base of the skull through the vertex without intravenous contrast. COMPARISON:  Head CT 02/19/2017, brain MRI 01/25/2017. FINDINGS: Brain: Stable cerebral volume. No midline shift, ventriculomegaly, mass effect, evidence of mass lesion, intracranial hemorrhage or evidence of cortically based acute infarction. Stable gray-white matter differentiation throughout the brain, with no encephalomalacia identified. Vascular: Calcified atherosclerosis at the skull base. No suspicious intracranial vascular hyperdensity. Skull: No acute osseous abnormality identified. Sinuses/Orbits: Visualized paranasal sinuses and mastoids are stable and well pneumatized. Other: No acute orbit or scalp soft tissue findings. IMPRESSION: No acute intracranial abnormality. Stable non contrast CT appearance of the brain. Electronically Signed   By: Genevie Ann M.D.   On: 02/10/2017 14:11   Ct Chest W Contrast  Result Date: 02/14/2017 CLINICAL DATA:  Bilateral hilar fullness seen on recent CXR. Weakness and sluggishness with weight loss. EXAM: CT CHEST WITH CONTRAST TECHNIQUE: Multidetector CT imaging of the chest was  performed during intravenous contrast administration. CONTRAST:  73m ISOVUE-300 IOPAMIDOL (ISOVUE-300) INJECTION 61% COMPARISON:  Same day CXR FINDINGS: Cardiovascular: There is aortic atherosclerosis without dissection or aneurysm. The ascending aorta measures up to 3.5 cm in caliber. No large central pulmonary embolus is noted. Heart size is top normal coronary arteriosclerosis. Trace pericardial effusion. Mediastinum/Nodes: Bulky mediastinal and bilateral hilar lymphadenopathy, index lesions are as follows: Subcarinal 2.7 cm short axis, right hilar 2 cm short axis, left hilar 2.1 cm short axis, AP window 2.2 cm short axis, right lower paratracheal 1.5 cm short axis, and left lower paratracheal 1.8 cm short axis. Right supraclavicular and retroclavicular adenopathy is also noted, the largest is supraclavicular measuring 1.3 cm short axis on the right. No thyromegaly or mass. The trachea and mainstem bronchi are patent without occlusion or intraluminal filling defects. The esophagus is not well visualized but there also appears to be a 1.5 cm paraesophageal lymph node along its distal aspect. Lungs/Pleura: There is a small right pleural effusion with adjacent atelectasis. Diffuse interstitial septal thickening is noted with tiny nonspecific subpleural nodular densities in the upper lobes bilaterally and ill-defined nonspecific subpleural right upper lobe pulmonary opacities which may reflect postinfectious or postinflammatory change. There is a more dominant 1 cm left upper lobe masslike opacity, series 7 image 71 which may represent a continuation of the patient's lymphadenopathy. Upper Abdomen: The partly included liver, spleen and adrenal glands demonstrate no acute appearing abnormalities. There are small retrocrural lymph nodes, the largest approximately 0.8 cm. Musculoskeletal: No chest wall abnormality. No acute or significant osseous findings. Mild midthoracic kyphosis attributable to degenerative disc  disease. IMPRESSION: 1. The overarching finding is that of mediastinal, bilateral hilar, supraclavicular, retroclavicular as well as paraesophageal lymphadenopathy raising concern for lymphoma or repeat more remotely metastatic lymphadenopathy. 2. Septal thickening with small right  effusion and patchy airspace opacities predominantly in the right upper lobe may reflect stigmata of pulmonary edema. Tiny nonspecific bilateral subpleural nodules are present which may also reflect changes of lymphoma or potentially metastatic disease. Infectious or inflammatory nodules not entirely excluded. 3. Aortic atherosclerosis. Aortic Atherosclerosis (ICD10-I70.0). Electronically Signed   By: Ashley Royalty M.D.   On: 02/16/2017 18:37   Dg Femur Min 2 Views Right  Result Date: 02/26/2017 CLINICAL DATA:  Pain following fall EXAM: RIGHT FEMUR 2 VIEWS COMPARISON:  None. FINDINGS: Frontal and lateral views were obtained. No fracture or dislocation. No appreciable knee joint effusion. No abnormal periosteal reaction. Joint spaces appear unremarkable. IMPRESSION: No fracture or dislocation. No appreciable arthropathy. No knee joint effusion. Electronically Signed   By: Lowella Grip III M.D.   On: 02/17/2017 13:55   US Abdomen Limited Ruq  Result Date: 02/20/2017 CLINICAL DATA:  Abnormal transaminases. Unspecified disorder of liver function. Cholecystectomy. EXAM: ULTRASOUND ABDOMEN LIMITED RIGHT UPPER QUADRANT COMPARISON:  None. FINDINGS: Gallbladder: Surgically absent. Common bile duct: Diameter: Normal at 4.9 mm.  No evidence of choledocholithiasis Liver: No focal lesion identified. No biliary dilatation. Hepatopetal flow within the main portal vein. IMPRESSION: 1. Status post cholecystectomy. 2. No ultrasound findings for the patient's elevated transaminase. Electronically Signed   By: Ashley Royalty M.D.   On: 02/18/2017 17:28        Scheduled Meds: . donepezil  10 mg Oral QHS   Continuous Infusions: .  azithromycin Stopped (02/22/17 1037)  . ceFEPime (MAXIPIME) IV Stopped (02/22/17 1632)  .  sodium bicarbonate  infusion 1000 mL    . sodium chloride    . vancomycin    . vancomycin       LOS: 2 days    Time spent: 35 minutes.     Elmarie Shiley, MD [facility] Pager [phone]  If 7PM-7AM, please contact night-coverage [email or web address] Password [facility] 02/23/2017, 7:36 AM

## 2017-02-23 NOTE — Progress Notes (Signed)
  Echocardiogram 2D Echocardiogram has been performed.  Gail Noble 02/23/2017, 9:00 AM

## 2017-02-23 NOTE — Consult Note (Addendum)
PULMONARY / CRITICAL CARE MEDICINE   Name: Gail Noble MRN: [id number] DOB: 10/29/1933    ADMISSION DATE:  02/04/2017 CONSULTATION DATE:  02/23/2017  REFERRING MD:  Dr. Tyrell Antonio  CHIEF COMPLAINT:  Weakness  HISTORY OF PRESENT ILLNESS:   This is an 83 year old female with dementia who comes to the [facility] for evaluation of weakness thrombocytopenia and bruising. She was in her usual state of health 2 weeks ago but approximately 10 days prior to admission she was noted to be weaker than normal. Her primary care physician saw her when her family prompted her to come in and they diagnosed her with a urinary tract infection. She was treated with ciprofloxacin. However, 2 days later her weakness progressed and she was unable to get out of the car by herself. She saw her primary care physician again, he collected blood work, and based on those results recommended that she be admitted to the hospital.  Here at [facility] she had a chest x-ray which showed evidence of mesangial lymphadenopathy so CT scan of the chest was performed that showed used on one hilar adenopathy. There is also some question of interlobular septal thickening seen on CT scanning of the chest. She has been treated for a urinary tract infection with broad-spectrum antibiotics and she's had a persistently elevated lactic acidosis.  She's received IV fluids aggressively as well as IV antibiotics. During this time she's had normal blood pressure and normal urine output. Pulmonary and critical care medicine was consulted for evaluation of an elevated lactic acid and concern for sepsis. Of note, she's had no fever or chills during this hospitalization nor does she complain of dysuria.  PAST MEDICAL HISTORY :  She  has a past medical history of Depression and Generalized headaches.  PAST SURGICAL HISTORY: She  has a past surgical history that includes Abdominal hysterectomy; Cholecystectomy; Eye surgery; and  Toe Surgery.  No Known Allergies  No current facility-administered medications on file prior to encounter.    Current Outpatient Prescriptions on File Prior to Encounter  Medication Sig  . atorvastatin (LIPITOR) 20 MG tablet Take 20 mg by mouth daily.  . diazepam (VALIUM) 5 MG tablet Take 5 mg by mouth every 6 (six) hours as needed.  . fish oil-omega-3 fatty acids 1000 MG capsule Take 2 g by mouth daily.  . sertraline (ZOLOFT) 100 MG tablet Take 100 mg by mouth daily. Take 2 tablets daily    FAMILY HISTORY:  Her has no family status information on file.    SOCIAL HISTORY: She  reports that she has never smoked. She has never used smokeless tobacco. She reports that she does not drink alcohol or use drugs.  REVIEW OF SYSTEMS:   Cannot obtain due to dementia  SUBJECTIVE:  As above  VITAL SIGNS: BP (!) 140/47   Pulse (!) 113   Temp 99 F (37.2 C) (Oral)   Resp (!) 28   Ht 5\' 3"  (1.6 m)   Wt 66 kg (145 lb 8.1 oz)   SpO2 99%   BMI 25.77 kg/m   HEMODYNAMICS:    VENTILATOR SETTINGS:    INTAKE / OUTPUT: I/O last 3 completed shifts: In: 4457.5 [P.O.:360; I.V.:3797.5; IV Piggyback:300] Out: 900 [Urine:900]  PHYSICAL EXAMINATION:  General:  Frail, weak resting comfortably in bed HENT: NCAT OP clear PULM: Crackles upper lobes, clear otherwise, normal effort CV: RRR, no mgr GI: BS+, soft, nontender MSK: normal bulk and tone Derm: some bruising over extensor surfaces Neuro:  awake, alert, no distress, MAEW   LABS:  BMET  Recent Labs Lab 02/26/2017 1652 02/22/17 0310 02/23/17 0320  NA 136 138 138  K 3.5 4.2 3.7  CL 96* 100* 103  CO2 21* 19* 16*  BUN 24* 22* 27*  CREATININE 1.02* 0.93 0.97  GLUCOSE 88 72 83    Electrolytes  Recent Labs Lab 02/12/2017 1652 02/22/17 0310 02/23/17 0320  CALCIUM 8.9 8.8* 9.0    CBC  Recent Labs Lab 02/10/2017 1228 03/05/2017 1618 02/22/17 0310 02/23/17 0320  WBC 13.8*  --  19.5* 16.2*  HGB 9.4*  --  9.3* 9.5*   HCT 27.5*  --  27.2* 28.0*  PLT 33* 37* 48* 32*    Coag's  Recent Labs Lab 02/15/2017 1418 03/04/2017 1618  APTT  --  29  INR 1.23 1.26    Sepsis Markers  Recent Labs Lab 02/11/2017 1652  02/22/17 1053 02/23/17 0321 02/23/17 0626  LATICACIDVEN  --   < > 4.7* 6.4* 6.0*  PROCALCITON 1.76  --   --   --   --   < > = values in this interval not displayed.  ABG No results for input(s): PHART, PCO2ART, PO2ART in the last 168 hours.  Liver Enzymes  Recent Labs Lab 02/20/2017 1228 02/22/17 0310 02/23/17 0320  AST 355* 332* 374*  ALT 53 52 60*  ALKPHOS 330* 330* 384*  BILITOT 1.3* 1.4* 1.8*  ALBUMIN 2.9* 2.7* 2.8*    Cardiac Enzymes No results for input(s): TROPONINI, PROBNP in the last 168 hours.  Glucose No results for input(s): GLUCAP in the last 168 hours.  Imaging No results found.   STUDIES:  July 2018 CTs chest images independently reviewed showing some intralobular septal thickening in the bilateral upper lobes, some nodularity versus atelectasis in the right middle lobe, there is remarkable hilar, paraesophageal, mediastinal adenopathy.  CULTURES: July 19 urine culture no growth July 19 blood culture no growth  ANTIBIOTICS: July 19 vancomycin July 19 Zosyn  SIGNIFICANT EVENTS: 02/23/2017 admitted to the hospital  LINES/TUBES: None  DISCUSSION: This is an 83 year old female with a past medical history significant for dementia who has been admitted to the hospital for evaluation of generalized weakness, leukocytosis, and a urinary tract infection. Objectively, she has thrombocytopenia, pyuria, leukocytosis, weakness, anemia, isolated elevated alkaline phosphatase, slightly elevated bilirubin, slightly decreased albumin, and imaging which shows mediastinal lymphadenopathy. There is no clear evidence of infection other than the urinary tract infection. She does not have physical exam findings or vital signs which support a diagnosis of sepsis.  Pulmonary  and critical care medicine has been consulted for evaluation of an elevated lactic acid. Lactic acidosis like all signs and symptoms which which patients will present with has a differential diagnosis. Just because the lactic acid is elevated does not mean that the patient is septic. The differential diagnosis of an elevated lactic acid includes drug effect causing mitochondrial abnormalities (no clear evidence of that based on my review of her pharmacologic history), shock state (again, no evidence of that based on vital signs), some small bowel issues, HIV infection.    In this particular situation I think the most likely explanation is an underlying hematologic or lymphatic malignancy as there is no evidence of shock based on her vital signs, urine output.    ASSESSMENT / PLAN:    CARDIOVASCULAR A:  Sinus tachycardia likely related to underlying hematologic malignancy P:  Stop giving aggressive IV resuscitation KVO crystalloid  RENAL A:   Metabolic  acidosis: currently she has a gap acidosis due to lactic acidosis; uncertain if there is a concomitant underlying non-gap acidosis  P:   Stop sodium bicarbonate infusion  If you desire to work her up for an underlying non-gap metabolic acidosis I recommend the following 1) collect an ABG 2) draw a BMET at the same time you collect the ABG 3) measure the anion gap 4) if an elevated anion gap, calculate the predicted pCO2 based on Winter's equation to assess whether or not respiratory compensation is appropriate 5) Calculate the delta gap (actual anion gap minus the expected anion gap (albumin x3) 6) calculate the corrected bicarbonate: add the delta gap back to the patients measured serum bicarbonate level, if this is less than 20 then you have an underlying non-gap metabolic acidosis and at that point you need to work that up (refer to Up To Date or Harrison's textbook of internal medicine) and restart the bicarbonate  infusion   HEMATOLOGIC A:   Mediastinal/hilar adenopathy with weakness and thrombocytopenia of abrupt onset: likely hematologic malignancy P:  Needs biopsy  INFECTIOUS A:   UTI P:   F/u cultures  Overall prognosis here seems poor given clinical scenario and profound weakness.  PCCM will sign off  FAMILY  - Updates: updated family at length 7/21  - Inter-disciplinary family meet or Palliative Care meeting due by:  day Liberal, MD [place] [facility] Pager: [phone] Cell: [phone] After 3pm or if no response, call [phone]  02/23/2017, 10:39 AM

## 2017-02-23 NOTE — Progress Notes (Signed)
CRITICAL VALUE ALERT  Critical Value:  Lactic acid 6.4  Date & Time Notied:  0407 7/21  Provider Notified: Walden Field, NP  Orders Received/Actions taken: bolus ordered and started  Hoyle Barr

## 2017-02-24 ENCOUNTER — Inpatient Hospital Stay (HOSPITAL_COMMUNITY): Payer: Medicare HMO

## 2017-02-24 DIAGNOSIS — I3139 Other pericardial effusion (noninflammatory): Secondary | ICD-10-CM

## 2017-02-24 DIAGNOSIS — E872 Acidosis, unspecified: Secondary | ICD-10-CM

## 2017-02-24 DIAGNOSIS — R591 Generalized enlarged lymph nodes: Secondary | ICD-10-CM

## 2017-02-24 DIAGNOSIS — E790 Hyperuricemia without signs of inflammatory arthritis and tophaceous disease: Secondary | ICD-10-CM

## 2017-02-24 DIAGNOSIS — I313 Pericardial effusion (noninflammatory): Secondary | ICD-10-CM

## 2017-02-24 DIAGNOSIS — C8372 Burkitt lymphoma, intrathoracic lymph nodes: Secondary | ICD-10-CM

## 2017-02-24 DIAGNOSIS — I959 Hypotension, unspecified: Secondary | ICD-10-CM

## 2017-02-24 DIAGNOSIS — R918 Other nonspecific abnormal finding of lung field: Secondary | ICD-10-CM

## 2017-02-24 DIAGNOSIS — R410 Disorientation, unspecified: Secondary | ICD-10-CM

## 2017-02-24 LAB — COMPREHENSIVE METABOLIC PANEL
ALK PHOS: 421 U/L — AB (ref 38–126)
ALT: 71 U/L — AB (ref 14–54)
AST: 445 U/L — ABNORMAL HIGH (ref 15–41)
Albumin: 2.7 g/dL — ABNORMAL LOW (ref 3.5–5.0)
Anion gap: 24 — ABNORMAL HIGH (ref 5–15)
BILIRUBIN TOTAL: 2 mg/dL — AB (ref 0.3–1.2)
BUN: 26 mg/dL — ABNORMAL HIGH (ref 6–20)
CALCIUM: 8.9 mg/dL (ref 8.9–10.3)
CO2: 11 mmol/L — ABNORMAL LOW (ref 22–32)
CREATININE: 0.94 mg/dL (ref 0.44–1.00)
Chloride: 103 mmol/L (ref 101–111)
GFR, EST NON AFRICAN AMERICAN: 55 mL/min — AB (ref >60)
Glucose, Bld: 71 mg/dL (ref 65–99)
Potassium: 3.8 mmol/L (ref 3.5–5.1)
Sodium: 138 mmol/L (ref 135–145)
TOTAL PROTEIN: 5.5 g/dL — AB (ref 6.5–8.1)

## 2017-02-24 LAB — BASIC METABOLIC PANEL
BUN: 28 mg/dL — AB (ref 6–20)
CHLORIDE: 104 mmol/L (ref 101–111)
CO2: <7 mmol/L — ABNORMAL LOW (ref 22–32)
CREATININE: 1.12 mg/dL — AB (ref 0.44–1.00)
Calcium: 9.5 mg/dL (ref 8.9–10.3)
GFR calc Af Amer: 52 mL/min — ABNORMAL LOW (ref >60)
GFR calc non Af Amer: 45 mL/min — ABNORMAL LOW (ref >60)
Glucose, Bld: 52 mg/dL — ABNORMAL LOW (ref 65–99)
Potassium: 4 mmol/L (ref 3.5–5.1)
Sodium: 139 mmol/L (ref 135–145)

## 2017-02-24 LAB — CBC
HCT: 26.6 % — ABNORMAL LOW (ref 36.0–46.0)
HEMOGLOBIN: 8.6 g/dL — AB (ref 12.0–15.0)
MCH: 28.7 pg (ref 26.0–34.0)
MCHC: 32.3 g/dL (ref 30.0–36.0)
MCV: 88.7 fL (ref 78.0–100.0)
Platelets: 42 10*3/uL — ABNORMAL LOW (ref 150–400)
RBC: 3 MIL/uL — AB (ref 3.87–5.11)
RDW: 16.4 % — ABNORMAL HIGH (ref 11.5–15.5)
WBC: 20.7 10*3/uL — ABNORMAL HIGH (ref 4.0–10.5)

## 2017-02-24 LAB — BLOOD GAS, ARTERIAL
DRAWN BY: 103701
FIO2: 21
O2 SAT: 92 %
PATIENT TEMPERATURE: 98.6
PO2 ART: 96.4 mmHg (ref 83.0–108.0)
pH, Arterial: 7.056 — CL (ref 7.350–7.450)

## 2017-02-24 LAB — GLUCOSE, CAPILLARY
GLUCOSE-CAPILLARY: 45 mg/dL — AB (ref 65–99)
Glucose-Capillary: 123 mg/dL — ABNORMAL HIGH (ref 65–99)

## 2017-02-24 LAB — LACTIC ACID, PLASMA: LACTIC ACID, VENOUS: 9.2 mmol/L — AB (ref 0.5–1.9)

## 2017-02-24 LAB — LACTATE DEHYDROGENASE

## 2017-02-24 MED ORDER — HALOPERIDOL LACTATE 2 MG/ML PO CONC
0.5000 mg | ORAL | Status: DC | PRN
Start: 2017-02-24 — End: 2017-02-25
  Filled 2017-02-24: qty 0.3

## 2017-02-24 MED ORDER — DEXAMETHASONE SODIUM PHOSPHATE 4 MG/ML IJ SOLN
40.0000 mg | INTRAMUSCULAR | Status: DC
  Filled 2017-02-24: qty 10

## 2017-02-24 MED ORDER — ACETAMINOPHEN 650 MG RE SUPP: 650.0000 mg | Freq: Four times a day (QID) | RECTAL | Status: DC | PRN

## 2017-02-24 MED ORDER — POLYVINYL ALCOHOL 1.4 % OP SOLN
1.0000 [drp] | Freq: Four times a day (QID) | OPHTHALMIC | Status: DC | PRN
Start: 2017-02-24 — End: 2017-02-25
  Filled 2017-02-24: qty 15

## 2017-02-24 MED ORDER — ACETAMINOPHEN 325 MG PO TABS: 650.0000 mg | ORAL_TABLET | Freq: Four times a day (QID) | ORAL | Status: DC | PRN

## 2017-02-24 MED ORDER — GLYCOPYRROLATE 0.2 MG/ML IJ SOLN
0.2000 mg | INTRAMUSCULAR | Status: DC | PRN
Start: 2017-02-24 — End: 2017-02-25
  Filled 2017-02-24: qty 1

## 2017-02-24 MED ORDER — GLYCOPYRROLATE 0.2 MG/ML IJ SOLN
0.2000 mg | INTRAMUSCULAR | Status: DC | PRN
  Filled 2017-02-24 (×2): qty 1

## 2017-02-24 MED ORDER — ONDANSETRON 4 MG PO TBDP: 4.0000 mg | ORAL_TABLET | Freq: Four times a day (QID) | ORAL | Status: DC | PRN

## 2017-02-24 MED ORDER — RASBURICASE 1.5 MG IV SOLR
6.0000 mg | Freq: Once | INTRAVENOUS | Status: DC
  Filled 2017-02-24: qty 4

## 2017-02-24 MED ORDER — DEXTROSE 50 % IV SOLN
1.0000 | Freq: Once | INTRAVENOUS | Status: AC
  Administered 2017-02-24: 50 mL via INTRAVENOUS

## 2017-02-24 MED ORDER — MORPHINE SULFATE (PF) 2 MG/ML IV SOLN: 2.0000 mg | INTRAVENOUS | Status: DC | PRN

## 2017-02-24 MED ORDER — DEXAMETHASONE SODIUM PHOSPHATE 10 MG/ML IJ SOLN: 40.0000 mg | INTRAMUSCULAR | Status: DC

## 2017-02-24 MED ORDER — DEXTROSE 50 % IV SOLN
INTRAVENOUS | Status: AC
  Filled 2017-02-24: qty 50

## 2017-02-24 MED ORDER — SODIUM CHLORIDE 0.9 % IV BOLUS (SEPSIS)
500.0000 mL | Freq: Once | INTRAVENOUS | Status: AC
  Administered 2017-02-24: 500 mL via INTRAVENOUS

## 2017-02-24 MED ORDER — LORAZEPAM 2 MG/ML IJ SOLN: 0.5000 mg | INTRAMUSCULAR | Status: DC | PRN

## 2017-02-24 MED ORDER — HALOPERIDOL LACTATE 5 MG/ML IJ SOLN: 0.5000 mg | INTRAMUSCULAR | Status: DC | PRN

## 2017-02-24 MED ORDER — GLYCOPYRROLATE 1 MG PO TABS
1.0000 mg | ORAL_TABLET | ORAL | Status: DC | PRN
  Filled 2017-02-24: qty 1

## 2017-02-24 MED ORDER — ENSURE ENLIVE PO LIQD
237.0000 mL | Freq: Two times a day (BID) | ORAL | Status: DC
  Administered 2017-02-24: 237 mL via ORAL

## 2017-02-24 MED ORDER — ONDANSETRON HCL 4 MG/2ML IJ SOLN: 4.0000 mg | Freq: Four times a day (QID) | INTRAMUSCULAR | Status: DC | PRN

## 2017-02-24 MED ORDER — BIOTENE DRY MOUTH MT LIQD: 15.0000 mL | OROMUCOSAL | Status: DC | PRN

## 2017-02-24 MED ORDER — HALOPERIDOL 0.5 MG PO TABS
0.5000 mg | ORAL_TABLET | ORAL | Status: DC | PRN
  Filled 2017-02-24: qty 1

## 2017-02-24 MED ORDER — SODIUM BICARBONATE 8.4 % IV SOLN
INTRAVENOUS | Status: DC
  Administered 2017-02-24: 10:00:00 via INTRAVENOUS
  Filled 2017-02-24: qty 150

## 2017-02-24 NOTE — Progress Notes (Signed)
Pt expired at 2330. Verified by Oletha Cruel RN. Pts daughter present in room at the time of death. Hortencia Conradi RN

## 2017-02-24 NOTE — Progress Notes (Signed)
PULMONARY / CRITICAL CARE MEDICINE   Name: Gail Noble MRN: [id number] DOB: 10/19/1933    ADMISSION DATE:  03/02/2017 CONSULTATION DATE:  02/23/2017  REFERRING MD:  Dr. Tyrell Antonio  CHIEF COMPLAINT:  Weakness  BRIEF: 83 y/o female was admitted 7/19 with weakness and thrombocytopenia.  She has been found to have a profound metabolic acidosis (lactic acidosis) and mediastinal lymphadenopathy.     SUBJECTIVE:  More lethargic this morning Weak Acidosis worsening  VITAL SIGNS: BP (!) 102/48   Pulse (!) 110   Temp (!) 97.4 F (36.3 C) (Oral)   Resp (!) 29   Ht 5\' 3"  (1.6 m)   Wt 70.1 kg (154 lb 8.7 oz)   SpO2 98%   BMI 27.38 kg/m   HEMODYNAMICS:    VENTILATOR SETTINGS:    INTAKE / OUTPUT: I/O last 3 completed shifts: In: 1689.3 [P.O.:120; I.V.:1369.3; IV Piggyback:200] Out: 350 [Urine:350]  PHYSICAL EXAMINATION:  General:  Frail, weak, resting comfortably in bed HENT: NCAT OP clear PULM: CTA B, normal effort CV: RRR, no mgr GI: BS+, soft, nontender MSK: normal bulk and tone Neuro: drowsy, confused   LABS:  BMET  Recent Labs Lab 02/23/17 0320 03/03/2017 0411 02/28/2017 0848  NA 138 138 139  K 3.7 3.8 4.0  CL 103 103 104  CO2 16* 11* <7*  BUN 27* 26* 28*  CREATININE 0.97 0.94 1.12*  GLUCOSE 83 71 52*    Electrolytes  Recent Labs Lab 02/23/17 0320 02/09/2017 0411 02/13/2017 0848  CALCIUM 9.0 8.9 9.5    CBC  Recent Labs Lab 02/22/17 0310 02/23/17 0320 02/11/2017 0411  WBC 19.5* 16.2* 20.7*  HGB 9.3* 9.5* 8.6*  HCT 27.2* 28.0* 26.6*  PLT 48* 32* 42*    Coag's  Recent Labs Lab 02/23/2017 1418 03/03/2017 1618  APTT  --  29  INR 1.23 1.26    Sepsis Markers  Recent Labs Lab 02/23/2017 1652  02/23/17 0626 02/23/17 1843 02/23/2017 0013  LATICACIDVEN  --   < > 6.0* 7.8* 9.2*  PROCALCITON 1.76  --   --   --   --   < > = values in this interval not displayed.  ABG  Recent Labs Lab 02/12/2017 0830  PHART 7.056*  PCO2ART BELOW  REPORTABLE RANGE  PO2ART 96.4    Liver Enzymes  Recent Labs Lab 02/22/17 0310 02/23/17 0320 02/14/2017 0411  AST 332* 374* 445*  ALT 52 60* 71*  ALKPHOS 330* 384* 421*  BILITOT 1.4* 1.8* 2.0*  ALBUMIN 2.7* 2.8* 2.7*    Cardiac Enzymes No results for input(s): TROPONINI, PROBNP in the last 168 hours.  Glucose No results for input(s): GLUCAP in the last 168 hours.  Imaging Ct Abdomen Pelvis W Contrast  Result Date: 02/23/2017 CLINICAL DATA:  Lymphadenopathy, possible lymphoma. Urosepsis. Dementia. EXAM: CT ABDOMEN AND PELVIS WITH CONTRAST TECHNIQUE: Multidetector CT imaging of the abdomen and pelvis was performed using the standard protocol following bolus administration of intravenous contrast. CONTRAST:  100 mL Isovue 300 IV COMPARISON:  Partial comparison is CT chest dated 02/17/2017 FINDINGS: Lower chest: Small to moderate pericardial effusion. Small right pleural effusion. Left infrahilar lymphadenopathy. These findings are better visualized on recent CT chest. Hepatobiliary: Macronodular hepatic contour with hepatomegaly. No focal hepatic lesion is seen. Status post cholecystectomy. No intrahepatic or extrahepatic duct dilatation. Pancreas: Within normal limits. Spleen: Within normal limits for size. Adrenals/Urinary Tract: Adrenal glands within normal limits. Heterogeneous perfusion of the right kidney with three focal hypoenhancing lesion measuring up to 1.6  cm in the lateral right upper kidney (series 7/image 11). Mildly heterogeneous perfusion of the left kidney. This appearance is nonspecific but could reflect pyelonephritis given the clinical history of urosepsis. No hydronephrosis. Bladder is notable for layering excretory contrast (series 2/image 71). Stomach/Bowel: Stomach is within normal limits. No evidence of bowel obstruction. Mildly thick-walled loops of bowel in the central abdomen (series 2/ images 47 and 54), nonspecific, possibly reflecting infectious/inflammatory  enteritis. Vascular/Lymphatic: No evidence of abdominal aortic aneurysm. Atherosclerotic calcifications of the abdominal aorta and branch vessels. Abdominopelvic lymphadenopathy, including: --1.2 cm short axis gastrohepatic node (series 2/ image 25) --1.2 cm short axis aortocaval node (series 2/ image 30) --1.7 cm short axis jejunal mesentery node (series 2/ image 42) --2.1 cm short axis right common iliac node (series 2/ image 50) --Bilateral obturator nodes measuring 2.0 cm short axis on the right and 1.8 cm short axis on the left (series 2/ image 61) --2.4 cm short axis right deep inguinal node (series 2/ image 62) --2.0 cm short axis right inguinal node (series 2/ image 77) Reproductive: Status post hysterectomy. Bilateral ovaries are unremarkable. Other: Trace pelvic ascites. Mild body wall edema. Musculoskeletal: Visualized osseous structures are within normal limits. IMPRESSION: Abdominopelvic lymphadenopathy, including a 2.0 cm short axis right inguinal node, suspicious for lymphoma. Heterogeneous perfusion of the bilateral kidneys, right greater than left. Three possible focal underlying lesions in the right kidney, measuring up to 1.6 cm. This appearance favors pyelonephritis in the setting of clinically suspected urosepsis, although renal lymphoma could have this appearance. Mildly thick-walled loops of small bowel in the central abdomen, nonspecific, possibly reflecting infectious/inflammatory enteritis. While small bowel lymphoma is technically possible, this is considered unlikely given the long segment involvement. Macronodular hepatic contour with hepatomegaly. No focal hepatic lesion is seen. Small moderate pericardial effusion.  Small right pleural effusion. Electronically Signed   By: Julian Hy M.D.   On: 02/23/2017 14:26     STUDIES:  July 2018 CTs chest images independently reviewed showing some intralobular septal thickening in the bilateral upper lobes, some nodularity versus  atelectasis in the right middle lobe, there is remarkable hilar, paraesophageal, mediastinal adenopathy.  CULTURES: July 19 urine culture no growth July 19 blood culture no growth  ANTIBIOTICS: July 19 vancomycin > 7/21 July 19 Zosyn  SIGNIFICANT EVENTS: 02/10/2017 admitted to the hospital  LINES/TUBES: None  DISCUSSION: 83 y/o female with profound lactic acidosis, high LDH, thrombocytopenia here with CT chest findings of large diffuse adenopathy.  I have discussed the situation with her hospitalist, her hematology consultant, and cardiologist this morning.  She has a disease that man cannot cure.  She is profoundly weak and all objective testing to this point suggests a very aggressive form of lymphoma or leukemia.  The only way to proceed would be to perform a biopsy to confirm a diagnosis and then start chemotherapy.  At this point she is so frail and ill she would not be able to tolerate that and she would die.    I have called the patient's daughter today who understands.  Interestingly she says that this is the exact way in which the patient's mother died.  She requests that I meet with her brother today to discuss this further.  60 minutes of CCM time discussing her situation with her family and consultants.  Roselie Awkward, MD [place] [facility] Pager: [phone] Cell: [phone] After 3pm or if no response, call [phone]  , 9:36 AM

## 2017-02-24 NOTE — Progress Notes (Signed)
I believe that we have a much better idea as to what is going on with Gail Noble. She has, in my opinion, a high grade lymphoproliferative disorder. Her LDH is over 10,000. The only time I've seen and elevated LDH this high is Burkitt's lymphoma.  Her uric acid also was quite high. She does not have any renal insufficiency.  Her LDH is going up.  Again, I have to believe that we are looking at a high grade lymphoma.  I will start her on some steroids. This might help a little bit.  I will give her some rasburicase to help with the hyperuricemia.  She is a little bit more acidotic. She will likely need bicarbonate in her fluids.  She has a little bit of disorientation. I'm not sure if she remembered that I saw her yesterday. I am not sure if she is eating all that much.  I think that she will need more in the way of a reliable IV access. I would recommend a PICC line. Probably a double-lumen PICC line would be very helpful.  She's had no fever. Her blood pressure is a little on the lower side.  With her labs, her creatinine is 0.94. Her potassium is 3.8. Her alkaline phosphatase is 421. Her liver function tests are elevated.  She did have a CT of the abdomen and pelvis yesterday. She did have adenopathy. Her liver looked okay. There was some hepatomegaly. Again I have to suspect she probably has some infiltration by this lymphoproliferative process. Her spleen is not enlarged. Her lymph nodes are not that impressive. She does have some inguinal lymph nodes.  Again, I suspect that Gail Noble has a high-grade lymphoma. She does not have bulky adenopathy. I suspect that she probably has bone marrow involvement.  The next step is clearly a biopsy. By the CT scan, I think that biopsy one of the inguinal nodes would be the way to go. We will see if radiology can do this. We clearly will need a core biopsy.  I will start her on some allopurinol and she will get rasburicase today.   This is  a very delicate situation. She is not in the best of shape. If this is something that is aggressive, it is hard to say what the treatment options might be.  Lattie Haw, MD  1 Thessalonians 5:16-18

## 2017-02-24 NOTE — Progress Notes (Signed)
PROGRESS NOTE    [name]  YTK:[id number] DOB: [birth year] DOA: 02/12/2017 PCP: Shirline Frees, MD    Brief Narrative: Gail Noble is a 83 y.o. female with medical history significant of mild dementia, cholecystectomy, hyperlipidemia, who presents to ED refer by PCP due to abnormal labs. Patient was seeing by her PCP on Tuesday due to low energy, at some point she had some dysuria. She was diagnosed with UTI and was started on ciprofloxacin. Patient didn't improved, she went to see her PCP today who refer her to ED for further evaluation.   Patient report feeling weak,  tired, sluggish. Report also possibility of weight lost. Family has notice that patient has develops bruises. Patient report falling the other day and hitting herself with a dresser. She had bruise from that on her right leg.   Of note patient report seeing small amount of blood in the stool, few days ago.   ED Course: Patient was found to have potasium at 2.8, cr at 1.2, alkaline phosphatase 355, bili 1.3, AST 355. WBC at 13, Hb 9.4, platelet count 33. Lactic acid at 6------7. UA with too numerous to count WBC. CT head; no intracranial abnormality. Chest x ray: Soft tissue fullness in the perihilar regions. Adenopathy in the perihilar regions cannot be excluded. Femur x ray; no fracture, tibia; Soft tissue swelling lateral malleolar region. Evidence of old trauma in the lateral malleolar region. No acute fracture or dislocation. No appreciable abnormal periosteal reaction.   Assessment & Plan:   Active Problems:   Mild dementia   Sepsis ([place])   Acute lower UTI   Abnormal transaminases   Thrombocytopenia (HCC)   Pericardial effusion   Lymphadenopathy, generalized  1--Thrombocytopenia, Anemia; diffuse Lymphadenopathy;  -Peripheral smear with leukoerythroblastic reaction. Hematology consulted.  -CT chest; with mediastinal, bilateral hilar, supraclavicular, retroclavicular as well as paraesophageal  lymphadenopathy raising concern for lymphoma or repeat more remotely metastatic lymphadenopathy. Subpleural nodule.  C-T abdomen pelvis: abdominal pelvis lymphadenopathy, Heterogeneous perfusion of the bilateral kidneys, 3 focal lesion kidney could be infection or lymphoma.  Mildly thick-walled loops of small bowel in the central abdomen, nonspecific, possibly reflecting infectious/inflammatory enteritis. DIC ; elevated D dimer and elevated fibrinogen.  Anemia panel; B 12; 2.143, folate 6.5, ferritin 2.527, iron 133. Consistent with anemia of chronic diseases.  Plan for bone marrow biopsy/ discussed with Dr Marin Olp.  Discussed with Dr Marin Olp, plan to start IV steroids, Elitek.   Patient will also need Lymph node biopsy. I will ask surgery to see patient.     2-Metabolic Acidosis anion gap; -Lactic acidosis;  Lactic acidosis, likely secondary to malignancy. LDH 10,000 Discussed with Dr Marin Olp, he recommends IV bicarb Gtt.  Will get B-met and ABG to rule out non anion gap acidosis.  Would avoid IV bolus for increase lactic acid.  ABG, B-met ordered. Patient with [place] at 7.0, more lethargic. CCM consulted. Dr Lake Bells will discussed with family goals of care.   Transaminases;  This could be related to sepsis.  RUQ [place]. No liver abnormalities.  Continue to Hold Lipitor.  Follow trend.  Check hepatitis panel.   Hypothyroidism;  -Elevated TSH; T 3 low, and free T 4 normal -mildly elevated. Needs repeat labs. .  -Due to weakness, confusion,  will treat for symptomatic hypothyroidism.    -Sepsis; UTI, possible rule out.  Patient presents with hypothermia, SBP in the 100, Leukocytosis, thrombocytopenia. UA with too numerous to count WBC. CT infiltrates upper lobe.  Lactic acid at 6--7--5-4---6 Continue  with cefepime, vancomycin.  Follow up Blood culture no growth, urine culture pending Check ECHO.  Lactic acid still elevated. Will get CT abdomen. Check LDH. Will consult CCM./ Metabolic  acidosis; start bicarb gtt.   Episode of Blood stool; she report seeing blood stool days ago. Suspect related to thrombocytopenia.  Hb stable.   -Elevated D dimer; doppler negative   -Moderate pleural effusion;  Discussed with cardiology, they will arrange doppler MV and cardio will follow up in consultation   DVT prophylaxis: SCD.  Code Status:  Full Code.  Family Communication: daughter  Disposition Plan: patient is now comfort care, Dr Lake Bells discussed with family poor prognosis. Plan was to transition to comfort.   Consultants:   Oncology    Procedures: [place]; Status post cholecystectomy. 2. No ultrasound findings for the patient's elevated transaminase.    Antimicrobials:   Cefepime 7-19   Subjective: Sleepy, denies pain.    Objective: Vitals:   02/14/2017 0400 02/11/2017 0417 02/17/2017 0800 02/18/2017 0820  BP:  (!) 121/42 (!) 66/31 (!) 102/48  Pulse: (!) 110   (!) 110  Resp: (!) 28 (!) 26 (!) 29 (!) 29  Temp:      TempSrc:      SpO2: 100%   98%  Weight:      Height:        Intake/Output Summary (Last 24 hours) at 02/15/2017 0829 Last data filed at 02/06/2017 0500  Gross per 24 hour  Intake           369.33 ml  Output                0 ml  Net           369.33 ml   Filed Weights   02/22/17 0500 02/23/17 0325 02/27/2017 0155  Weight: 64 kg (141 lb 1.5 oz) 66 kg (145 lb 8.1 oz) 70.1 kg (154 lb 8.7 oz)    Examination:  General exam: more leathrgic Respiratory system: decrease breath sounds.  Cardiovascular system: S 1, S 2 RRR Gastrointestinal system; Bs present, soft  Central nervous system: sleepy, wake up, say few words  Extremities: Symmetric 5 x 5 power. Skin: multiples skin bruises.      Data Reviewed: I have personally reviewed following labs and imaging studies  CBC:  Recent Labs Lab 02/12/2017 1228 02/09/2017 1618 02/22/17 0310 02/23/17 0320 02/08/2017 0411  WBC 13.8*  --  19.5* 16.2* 20.7*  NEUTROABS 8.7*  --   --   --   --   HGB 9.4*  --   9.3* 9.5* 8.6*  HCT 27.5*  --  27.2* 28.0* 26.6*  MCV 85.9  --  86.1 85.1 88.7  PLT 33* 37* 48* 32* 42*   Basic Metabolic Panel:  Recent Labs Lab 02/28/2017 1228 02/04/2017 1652 02/22/17 0310 02/23/17 0320 02/08/2017 0411  NA 137 136 138 138 138  K 2.8* 3.5 4.2 3.7 3.8  CL 94* 96* 100* 103 103  CO2 20* 21* 19* 16* 11*  GLUCOSE 96 88 72 83 71  BUN 30* 24* 22* 27* 26*  CREATININE 1.23* 1.02* 0.93 0.97 0.94  CALCIUM 9.4 8.9 8.8* 9.0 8.9   GFR: Estimated Creatinine Clearance: 44.1 mL/min (by C-G formula based on SCr of 0.94 mg/dL). Liver Function Tests:  Recent Labs Lab 03/03/2017 1228 02/22/17 0310 02/23/17 0320 02/18/2017 0411  AST 355* 332* 374* 445*  ALT 53 52 60* 71*  ALKPHOS 330* 330* 384* 421*  BILITOT 1.3* 1.4* 1.8* 2.0*  PROT 5.5* 5.4* 5.4* 5.5*  ALBUMIN 2.9* 2.7* 2.8* 2.7*   No results for input(s): LIPASE, AMYLASE in the last 168 hours.  Recent Labs Lab 03/03/2017 1618  AMMONIA 29   Coagulation Profile:  Recent Labs Lab 02/06/2017 1418 03/01/2017 1618  INR 1.23 1.26   Cardiac Enzymes: No results for input(s): CKTOTAL, CKMB, CKMBINDEX, TROPONINI in the last 168 hours. BNP (last 3 results) No results for input(s): PROBNP in the last 8760 hours. HbA1C: No results for input(s): HGBA1C in the last 72 hours. CBG: No results for input(s): GLUCAP in the last 168 hours. Lipid Profile: No results for input(s): CHOL, HDL, LDLCALC, TRIG, CHOLHDL, LDLDIRECT in the last 72 hours. Thyroid Function Tests:  Recent Labs  02/20/2017 1228 02/22/17 0824  TSH 8.985*  --   FREET4  --  1.26*  T3FREE  --  1.2*   Anemia Panel:  Recent Labs  02/22/2017 1618  VITAMINB12 2,143*  FOLATE 6.5  FERRITIN 2,527*  TIBC 246*  IRON 133  RETICCTPCT 1.9   Sepsis Labs:  Recent Labs Lab 02/19/2017 1652  02/23/17 0321 02/23/17 0626 02/23/17 1843 02/04/2017 0013  PROCALCITON 1.76  --   --   --   --   --   LATICACIDVEN  --   < > 6.4* 6.0* 7.8* 9.2*  < > = values in this interval  not displayed.  Recent Results (from the past 240 hour(s))  Urine Culture     Status: None   Collection Time: 03/01/2017 12:10 PM  Result Value Ref Range Status   Specimen Description URINE, CLEAN CATCH  Final   Special Requests NONE  Final   Culture   Final    NO GROWTH Performed at [facility], [address], [place], [place] [address]    Report Status 02/23/2017 FINAL  Final  Culture, blood (routine x 2)     Status: None (Preliminary result)   Collection Time: 02/09/2017  4:17 PM  Result Value Ref Range Status   Specimen Description BLOOD RIGHT ARM  Final   Special Requests   Final    BOTTLES DRAWN AEROBIC AND ANAEROBIC Blood Culture adequate volume   Culture   Final    NO GROWTH 2 DAYS Performed at [facility], [address] [place], [place] [address]    Report Status PENDING  Incomplete  Culture, blood (routine x 2)     Status: None (Preliminary result)   Collection Time: 02/13/2017  4:51 PM  Result Value Ref Range Status   Specimen Description BLOOD LEFT ARM  Final   Special Requests   Final    BOTTLES DRAWN AEROBIC AND ANAEROBIC Blood Culture adequate volume   Culture   Final    NO GROWTH 2 DAYS Performed at [facility], [address], [place], [place] [address]    Report Status PENDING  Incomplete  MRSA PCR Screening     Status: None   Collection Time: 02/28/2017  7:04 PM  Result Value Ref Range Status   MRSA by PCR NEGATIVE NEGATIVE Final    Comment:        The GeneXpert MRSA Assay (FDA approved for NASAL specimens only), is one component of a comprehensive MRSA colonization surveillance program. It is not intended to diagnose MRSA infection nor to guide or monitor treatment for MRSA infections.          Radiology Studies: Ct Abdomen Pelvis W Contrast  Result Date: 02/23/2017 CLINICAL DATA:  Lymphadenopathy, possible  lymphoma. Urosepsis. Dementia. EXAM: CT ABDOMEN AND PELVIS WITH CONTRAST TECHNIQUE: Multidetector CT imaging of the  abdomen and pelvis was performed using the standard protocol following bolus administration of intravenous contrast. CONTRAST:  100 mL Isovue 300 IV COMPARISON:  Partial comparison is CT chest dated 03/02/2017 FINDINGS: Lower chest: Small to moderate pericardial effusion. Small right pleural effusion. Left infrahilar lymphadenopathy. These findings are better visualized on recent CT chest. Hepatobiliary: Macronodular hepatic contour with hepatomegaly. No focal hepatic lesion is seen. Status post cholecystectomy. No intrahepatic or extrahepatic duct dilatation. Pancreas: Within normal limits. Spleen: Within normal limits for size. Adrenals/Urinary Tract: Adrenal glands within normal limits. Heterogeneous perfusion of the right kidney with three focal hypoenhancing lesion measuring up to 1.6 cm in the lateral right upper kidney (series 7/image 11). Mildly heterogeneous perfusion of the left kidney. This appearance is nonspecific but could reflect pyelonephritis given the clinical history of urosepsis. No hydronephrosis. Bladder is notable for layering excretory contrast (series 2/image 71). Stomach/Bowel: Stomach is within normal limits. No evidence of bowel obstruction. Mildly thick-walled loops of bowel in the central abdomen (series 2/ images 47 and 54), nonspecific, possibly reflecting infectious/inflammatory enteritis. Vascular/Lymphatic: No evidence of abdominal aortic aneurysm. Atherosclerotic calcifications of the abdominal aorta and branch vessels. Abdominopelvic lymphadenopathy, including: --1.2 cm short axis gastrohepatic node (series 2/ image 25) --1.2 cm short axis aortocaval node (series 2/ image 30) --1.7 cm short axis jejunal mesentery node (series 2/ image 42) --2.1 cm short axis right common iliac node (series 2/ image 50) --Bilateral obturator nodes measuring 2.0 cm short axis on the right and 1.8 cm short axis on the left (series 2/ image 61) --2.4 cm short axis right deep inguinal node (series 2/  image 62) --2.0 cm short axis right inguinal node (series 2/ image 77) Reproductive: Status post hysterectomy. Bilateral ovaries are unremarkable. Other: Trace pelvic ascites. Mild body wall edema. Musculoskeletal: Visualized osseous structures are within normal limits. IMPRESSION: Abdominopelvic lymphadenopathy, including a 2.0 cm short axis right inguinal node, suspicious for lymphoma. Heterogeneous perfusion of the bilateral kidneys, right greater than left. Three possible focal underlying lesions in the right kidney, measuring up to 1.6 cm. This appearance favors pyelonephritis in the setting of clinically suspected urosepsis, although renal lymphoma could have this appearance. Mildly thick-walled loops of small bowel in the central abdomen, nonspecific, possibly reflecting infectious/inflammatory enteritis. While small bowel lymphoma is technically possible, this is considered unlikely given the long segment involvement. Macronodular hepatic contour with hepatomegaly. No focal hepatic lesion is seen. Small moderate pericardial effusion.  Small right pleural effusion. Electronically Signed   By: Julian Hy M.D.   On: 02/23/2017 14:26        Scheduled Meds: . donepezil  10 mg Oral QHS  . feeding supplement (ENSURE ENLIVE)  237 mL Oral BID BM  . levothyroxine  25 mcg Oral QAC breakfast   Continuous Infusions: . sodium chloride 10 mL/hr at 02/23/2017 0500  . ceFEPime (MAXIPIME) IV Stopped (02/23/17 2034)  . metronidazole Stopped (02/28/2017 0110)  .  sodium bicarbonate  infusion 1000 mL       LOS: 3 days    Time spent: 35 minutes.     Elmarie Shiley, MD [facility] Pager [phone]  If 7PM-7AM, please contact night-coverage [email or web address] Password [facility] 02/12/2017, 8:29 AM

## 2017-02-24 NOTE — Progress Notes (Signed)
CRITICAL VALUE ALERT  Critical Value:  Lactic acid 9.2  Date & Time Notied:  02/23/2017  0139  Provider Notified: Opyd  Orders Received/Actions taken: normal saline 565ml bolus ordered and given.

## 2017-02-24 NOTE — Plan of Care (Signed)
  Interdisciplinary Goals of Care Family Meeting   Date carried out:: 02/27/2017  Location of the meeting: Conference room  Member's involved: Physician and Family Member or next of kin  Durable Power of Attorney or acting medical decision maker: Helene Kelp (daughter) and Nicki Reaper (son)  Discussion: We discussed goals of care for Gail Noble .  She has a disease that man cannot cure.  She is profoundly weak and all objective testing to this point suggests a very aggressive form of lymphoma or leukemia.  The only way to proceed would be to perform a biopsy to confirm a diagnosis and then start chemotherapy.  At this point she is so frail and ill she would not be able to tolerate that and she would die   Code status: Full DNR  Disposition: In-patient comfort care  Time spent for the meeting: 35 minutes  Simonne Maffucci 02/21/2017, 10:38 AM

## 2017-02-24 NOTE — Consult Note (Addendum)
Cardiology Consultation:   Patient ID: Gail Noble; [id number]; [birth year]   Admit date: 02/15/2017 Date of Consult: 03/01/2017  Primary Care Provider: Shirline Frees, MD Primary Cardiologist: none Primary Electrophysiologist:  none   Patient Profile:   Gail Noble is a 83 y.o. female with a hx of dementia and hyperlipidemia who is being seen today for the evaluation of pericardial effusion  at the request of Dr. Frederic Jericho.  History of Present Illness:   Ms. Gail Noble is an 83yo femal with a history of mild dementia and hyperlipidemia who presented to her PCP with complaints of fatigue and dysuria and was diagnosed with a UTI and started on Cipro.  She did not improve with her fatigue and weakness and presented to [facility] ER.  Also reported weight loss and easy bruising.  In ER she was hypokalemic, LFTs were elevated, platelet count 33, WBC 13 and chest xray with soft tissue fullness in perihilar region. She was hypotensive and hypothermic and felt to have Urosepsis.  She was fluid resuscitated.  Peripheral blood smear showed leukoerythroblastic reaction and hematology was consulted.  Chest CT showed mediastinal, bilateral hilar, supraclavicular, retroclavicular and paraesophageal lymphadenopathy worrisome for lymphoma.  A 2D echo was ordered on admission due to hypotension and showed moderate pericardial effusion.  Cardiology is now asked to consult.  She denies any chest pain or pressure, SOB, DOE, PND or orthopnea.  No LE edema.    Past Medical History:  Diagnosis Date  . Depression   . Generalized headaches     Past Surgical History:  Procedure Laterality Date  . ABDOMINAL HYSTERECTOMY    . CHOLECYSTECTOMY    . EYE SURGERY    . TOE SURGERY       Inpatient Medications: Scheduled Meds:  Continuous Infusions:  PRN Meds:   Allergies:   No Known Allergies  Social History:   Social History   Social History  . Marital status: Married    Spouse name: N/A  . Number of  children: N/A  . Years of education: N/A   Occupational History  . Not on file.   Social History Main Topics  . Smoking status: Never Smoker  . Smokeless tobacco: Never Used  . Alcohol use No  . Drug use: No  . Sexual activity: Not on file   Other Topics Concern  . Not on file   Social History Narrative  . No narrative on file    Family History:   The patient's family history includes Alzheimer's disease in her father; Lymphoma in her mother.  ROS:  Please see the history of present illness.  ROS  All other ROS reviewed and negative.     Physical Exam/Data:   Vitals:   02/14/2017 0800 02/07/2017 0820 02/06/2017 1210 02/21/2017 2354  BP: (!) 66/31 (!) 102/48 (!) 124/39   Pulse:  (!) 110    Resp: (!) 29 (!) 29 (!) 29   Temp: (!) 97.4 F (36.3 C)     TempSrc: Oral     SpO2:  98%    Weight:    154 lb 8.7 oz (70.1 kg)  Height:       No intake or output data in the 24 hours ending 03/01/17 0812 Filed Weights   02/23/17 0325 02/28/2017 0155 02/19/2017 2354  Weight: 145 lb 8.1 oz (66 kg) 154 lb 8.7 oz (70.1 kg) 154 lb 8.7 oz (70.1 kg)   Body mass index is 27.38 kg/m.  General:  Well nourished, well developed, in  no acute distress HEENT: normal Lymph: no adenopathy Neck: no JVD Endocrine:  No thryomegaly Vascular: No carotid bruits; FA pulses 2+ bilaterally without bruits  Cardiac:  normal S1, S2; RRR; no murmur  Lungs:  clear to auscultation bilaterally, no wheezing, rhonchi or rales  Abd: soft, nontender, no hepatomegaly  Ext: trace pedal edema Musculoskeletal:  No deformities, BUE and BLE strength normal and equal Skin: warm and dry  Neuro:  CNs 2-12 intact, no focal abnormalities noted Psych:  Normal affect   EKG:  The EKG was personally reviewed and demonstrates:  NSR with no ST changes Telemetry:  Telemetry was personally reviewed and demonstrates:  NSR  Relevant CV Studies: 2D echo  Study Conclusions  - Left ventricle: The cavity size was normal. Systolic  function was   normal. The estimated ejection fraction was in the range of 60%   to 65%. Wall motion was normal; there were no regional wall   motion abnormalities. There was an increased relative   contribution of atrial contraction to ventricular filling.   Doppler parameters are consistent with abnormal left ventricular   relaxation (grade 1 diastolic dysfunction). - Aortic valve: Trileaflet; normal thickness, mildly calcified   leaflets. - Right ventricle: Systolic function was moderately reduced. - Pulmonary arteries: PA peak pressure: 51 mm Hg (S). - Pericardium, extracardiac: A moderate, free-flowing pericardial   effusion was identified circumferential to the heart. The fluid   had no internal echoes. There was mildright atrial chamber   collapse. - Impressions: Recommend Doppler through MV with respirometer to   determine if there is any repiratory variation in MV inflow given   moderate pericardial effusion  Impressions:  - Recommend Doppler through MV with respirometer to determine if   there is any repiratory variation in MV inflow given moderate   pericardial effusion    Laboratory Data:  Chemistry  Recent Labs Lab 02/23/17 0320 02/10/2017 0411 02/12/2017 0848  NA 138 138 139  K 3.7 3.8 4.0  CL 103 103 104  CO2 16* 11* <7*  GLUCOSE 83 71 52*  BUN 27* 26* 28*  CREATININE 0.97 0.94 1.12*  CALCIUM 9.0 8.9 9.5  GFRNONAA 53* 55* 45*  GFRAA >60 >60 52*  ANIONGAP 19* 24*  --      Recent Labs Lab 02/23/17 0320 02/12/2017 0411  PROT 5.4* 5.5*  ALBUMIN 2.8* 2.7*  AST 374* 445*  ALT 60* 71*  ALKPHOS 384* 421*  BILITOT 1.8* 2.0*   Hematology  Recent Labs Lab 02/23/17 0320 02/19/2017 0411  WBC 16.2* 20.7*  RBC 3.29* 3.00*  HGB 9.5* 8.6*  HCT 28.0* 26.6*  MCV 85.1 88.7  MCH 28.9 28.7  MCHC 33.9 32.3  RDW 15.7* 16.4*  PLT 32* 42*   Cardiac EnzymesNo results for input(s): TROPONINI in the last 168 hours. No results for input(s): TROPIPOC in the last  168 hours.  BNPNo results for input(s): BNP, PROBNP in the last 168 hours.  DDimer No results for input(s): DDIMER in the last 168 hours.  Radiology/Studies:  No results found.  Assessment and Plan:   1. Moderate pericardial effusion ? Etiology.  With diffuse lymphadenopathy on chest CT and concern for lymphoma, need to consider malignant effusion.  There was no obvious tamponade on echo.  There was RA inversion but no RV diastolic collpase.  MV inflow with respirometer was not done and this has been ordered and should be done today.  Given her significant thrombocytopenia, would not recommend diagnostic pericardiocentesis due to risk  of bleeding. Would avoid treatment with NSAIDs due to severe thrombocytopenia.  At this time will continue to follow while undergoing workup for lymphadenopathy.   2. Diffuse lymphadenopathy in chest with B symptoms including weakness, fatigue and weight loss.  Workup by Hematology in progress.    Signed, Fransico Him, MD  03/01/2017 8:12 AM

## 2017-02-25 DIAGNOSIS — I313 Pericardial effusion (noninflammatory): Secondary | ICD-10-CM

## 2017-02-26 ENCOUNTER — Other Ambulatory Visit: Payer: Self-pay | Admitting: Oncology

## 2017-02-26 LAB — CULTURE, BLOOD (ROUTINE X 2)
CULTURE: NO GROWTH
Culture: NO GROWTH
SPECIAL REQUESTS: ADEQUATE
Special Requests: ADEQUATE

## 2017-03-01 ENCOUNTER — Encounter (HOSPITAL_COMMUNITY): Payer: Self-pay | Admitting: Cardiology

## 2017-03-06 LAB — HEPATITIS PANEL, ACUTE
HCV Ab: <0.1 s/co ratio (ref 0.0–0.9)
HEP A IGM: NEGATIVE
HEP B C IGM: NEGATIVE
HEP B S AG: NEGATIVE

## 2017-03-06 LAB — IGG, IGA, IGM
IGM, SERUM: 24 mg/dL — AB (ref 26–217)
IgA: 66 mg/dL (ref 64–422)
IgG (Immunoglobin G), Serum: 79 mg/dL — ABNORMAL LOW (ref 700–1600)

## 2017-03-06 LAB — BETA 2 MICROGLOBULIN, SERUM: Beta-2 Microglobulin: 3.7 mg/L — ABNORMAL HIGH (ref 0.6–2.4)

## 2017-03-06 NOTE — Discharge Summary (Signed)
Death Summary  Gail Noble XTA:[id number] DOB: 1933-11-06 DOA: 03/21/17  PCP: Shirline Frees, MD  Admit date: 2017/03/21 Date of Death: March 24, 2017 Time of Death: 03-Nov-2318 Notification: Shirline Frees, MD notified of death of March 26, 2017  Final Diagnoses:  High Grade proliferative disorder.  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  Lactic acidosis secondary to malignancy Mild dementia Acute lower UTI Abnormal transaminases Thrombocytopenia (HCC) Pericardial effusion Lymphadenopathy, generalized Abnormal TSH  Sepsis ruled out.    History of present illness:  Gail Noble a very pleasant 83 y.o.femalewith medical history significant of mild dementia, cholecystectomy, hyperlipidemia, who presents to ED refer by PCP due to abnormal labs. Patient was seeing by her PCP on Tuesday due to low energy, at some point she had some dysuria. She was diagnosed with UTI and was started on ciprofloxacin. Patient didn't improved, she presented to ED for further evaluation.   Patient report feeling weak, tired, sluggish. Report also possibility of weight lost. Family has notice that patient has develops bruises. Patient report falling the other day and hitting herself with a dresser. She had bruise from that on her right leg.   Of note patient report seeing small amount of blood in the stool, few days ago.   ED Course:Patient was found to have potasium at 2.8, cr at 1.2, alkaline phosphatase 355, bili 1.3, AST 355. WBC at 13, Hb 9.4, platelet count 33. Lactic acid at 6------7. UA with too numerous to count WBC. CT head; no intracranial abnormality. Chest x ray: Soft tissue fullness in the perihilar regions. Adenopathy in the perihilar regions cannot be excluded. Femur x ray; no fracture, tibia; Soft tissue swelling lateral malleolar region. Evidence of old trauma in the lateral malleolar region. No acute fracture or dislocation. No appreciable abnormal periosteal reaction.   Assessment & Plan:     Patient was admitted to the hospital with initial diagnosis of sepsis, she was treated Agressively  with IV fluids, and broad spectrum  IV antibiotics. At the same time she was evaluated for hematologic disorder due to presentation of Thrombocytopenia. A CT chest showed diffuse lymphadenopathy and pericardial effusion. Plan was to perform a Bone Marrow biopsy. Due to fail of  improvement of lactic acidosis, CCM was consulted.  Over hospitalization she received Bicarb Gtt. CCM  thought that lactic acidosis was related to malignancy and no to Sepsis. An ABG was obtain for evaluation of acidosis, and PH was at 7.0. Dr Lake Bells with Critical discussed prognosis with multiples consultant and with family and decision was to withdrawal care and focus on comfort.   Patient presented with an aggressive malignancy involving probably Small bowel BM, Kidney and possible pericardial effusion.     --Thrombocytopenia, Anemia; diffuse Lymphadenopathy;  -Peripheral smear with leukoerythroblastic reaction. Hematology consulted.  -CT chest; with mediastinal, bilateral hilar, supraclavicular, retroclavicular as well as paraesophageal lymphadenopathy raising concern for lymphoma or repeat more remotely metastatic lymphadenopathy. Subpleural nodule.  C-T abdomen pelvis: abdominal pelvis lymphadenopathy, Heterogeneous perfusion of the bilateral kidneys, 3 focal lesion kidney could be infection or lymphoma.  Mildly thick-walled loops of small bowel in the central abdomen, nonspecific, possibly reflecting infectious/inflammatory enteritis. DIC ; elevated D dimer and elevated fibrinogen.  Anemia panel; B 12; 2.143, folate 6.5, ferritin 2.527, iron 133. Consistent with anemia of chronic diseases.  Plan for bone marrow biopsy on Monday  discussed with Dr Marin Olp.  Discussed with Dr Marin Olp, plan to start IV steroids, Elitek.   Patient will also need Lymph node biopsy. I will ask surgery to see patient.  2-Metabolic  Acidosis anion gap; -Lactic acidosis;  Lactic acidosis, likely secondary to malignancy. LDH 10,000 Discussed with Dr Marin Olp, he recommends IV bicarb Gtt.  Will get B-met and ABG to rule out non anion gap acidosis. ABG, B-met ordered. Patient with [place] at 7.0, more lethargic. CCM consulted. Dr Lake Bells will discussed with family goals of care.   Transaminases;  RUQ [place]. No liver abnormalities.  Continue to Hold Lipitor.  Follow trend.  Check hepatitis panel.   Hypothyroidism;  -Elevated TSH; T 3 low, and free T 4 normal -mildly elevated. Needs repeat labs. .  -Due to weakness, confusion,  she was started on synthroid.     -Sepsis; UTI, ruled out.  Patient presents with hypothermia, SBP in the 100, Leukocytosis, thrombocytopenia. UA with too numerous to count WBC. CT possible infiltrates upper lobe.  Lactic acid at 6--7--5-4---6 Treated with  cefepime, vancomycin.  Follow up Blood culture no growth, urine culture no growth.  ECHO was ordred. .  Lactic acid still elevated. CT abdomen obtain, CCM consulted.  Metabolic acidosis; received  bicarb gtt.   Episode of Blood stool;she report seeing blood stool days ago. Suspect related to thrombocytopenia.  Hb stable.   -Elevated D dimer; doppler negative   -Moderate pericardial  effusion;  Cardiology Consulted. Likely perical effusion of malignancy              The results of significant diagnostics from this hospitalization (including imaging, microbiology, ancillary and laboratory) are listed below for reference.    Significant Diagnostic Studies: Dg Chest 2 View  Result Date: 02/10/2017 CLINICAL DATA:  Pain following fall EXAM: CHEST  2 VIEW COMPARISON:  November 09, 2005. FINDINGS: There is no edema or consolidation. Heart size is normal. Pulmonary vascularity appears normal. There is soft tissue prominence in the perihilar regions bilaterally, an appearance concerning for adenopathy. There is aortic  atherosclerosis. There is upper thoracic levoscoliosis. No blastic or lytic bone lesions. IMPRESSION: Soft tissue fullness in the perihilar regions. Adenopathy in the perihilar regions cannot be excluded. Chest CT, ideally with intravenous contrast, is felt to be advisable in this regard. There is no frank edema or consolidation. Heart size within normal limits. There is aortic atherosclerosis. No acute fracture evident. No pneumothorax. Aortic Atherosclerosis (ICD10-I70.0). Electronically Signed   By: Lowella Grip III M.D.   On: 02/27/2017 13:58   Dg Pelvis 1-2 Views  Result Date: 02/20/2017 CLINICAL DATA:  Pain following fall EXAM: PELVIS - 1-2 VIEW COMPARISON:  None. FINDINGS: No fracture or dislocation. There is mild symmetric narrowing of both hip joints. There is also osteoarthritic change in the pubic symphysis. No erosive change. IMPRESSION: Areas of osteoarthritic change.  No fracture or dislocation evident. Electronically Signed   By: Lowella Grip III M.D.   On: 02/14/2017 13:55   Dg Tibia/fibula Right  Result Date: 02/10/2017 CLINICAL DATA:  Pain following fall EXAM: RIGHT TIBIA AND FIBULA - 2 VIEW COMPARISON:  None. FINDINGS: Frontal and lateral views were obtained. There is evidence of old trauma in the lateral malleolar region. There is soft tissue swelling in the lateral ankle region. No acute fracture or dislocation. No abnormal periosteal reaction. IMPRESSION: Soft tissue swelling lateral malleolar region. Evidence of old trauma in the lateral malleolar region. No acute fracture or dislocation. No appreciable abnormal periosteal reaction. Electronically Signed   By: Lowella Grip III M.D.   On: 02/27/2017 13:54   Ct Head Wo Contrast  Result Date: 02/20/2017 CLINICAL DATA:  83 year old female with weakness.  Recent falls. EXAM: CT HEAD WITHOUT CONTRAST TECHNIQUE: Contiguous axial images were obtained from the base of the skull through the vertex without intravenous contrast.  COMPARISON:  Head CT 02/19/2017, brain MRI 01/25/2017. FINDINGS: Brain: Stable cerebral volume. No midline shift, ventriculomegaly, mass effect, evidence of mass lesion, intracranial hemorrhage or evidence of cortically based acute infarction. Stable gray-white matter differentiation throughout the brain, with no encephalomalacia identified. Vascular: Calcified atherosclerosis at the skull base. No suspicious intracranial vascular hyperdensity. Skull: No acute osseous abnormality identified. Sinuses/Orbits: Visualized paranasal sinuses and mastoids are stable and well pneumatized. Other: No acute orbit or scalp soft tissue findings. IMPRESSION: No acute intracranial abnormality. Stable non contrast CT appearance of the brain. Electronically Signed   By: Genevie Ann M.D.   On: 02/09/2017 14:11   Ct Head Wo Contrast  Result Date: 02/19/2017 CLINICAL DATA:  Confusion, headaches and dementia. EXAM: CT HEAD WITHOUT CONTRAST TECHNIQUE: Contiguous axial images were obtained from the base of the skull through the vertex without intravenous contrast. COMPARISON:  MRI brain 01/25/2017 FINDINGS: Brain: Stable age related cerebral atrophy, ventriculomegaly and periventricular white matter disease. No extra-axial fluid collections are identified. No CT findings for acute hemispheric infarction or intracranial hemorrhage. No mass lesions. The brainstem and cerebellum are normal. Vascular: Stable vascular calcifications. No hyperdense vessels or definite aneurysm. Skull: No skull fracture bone lesion. Sinuses/Orbits: The paranasal sinuses and mastoid air cells are grossly clear. The globes are intact. Other: No scalp lesions or hematoma. IMPRESSION: 1. Age related cerebral atrophy, ventriculomegaly and periventricular white matter disease. 2. No acute intracranial findings or skull fracture. Electronically Signed   By: Marijo Sanes M.D.   On: 02/19/2017 11:28   Ct Chest W Contrast  Result Date: 02/18/2017 CLINICAL DATA:   Bilateral hilar fullness seen on recent CXR. Weakness and sluggishness with weight loss. EXAM: CT CHEST WITH CONTRAST TECHNIQUE: Multidetector CT imaging of the chest was performed during intravenous contrast administration. CONTRAST:  46m ISOVUE-300 IOPAMIDOL (ISOVUE-300) INJECTION 61% COMPARISON:  Same day CXR FINDINGS: Cardiovascular: There is aortic atherosclerosis without dissection or aneurysm. The ascending aorta measures up to 3.5 cm in caliber. No large central pulmonary embolus is noted. Heart size is top normal coronary arteriosclerosis. Trace pericardial effusion. Mediastinum/Nodes: Bulky mediastinal and bilateral hilar lymphadenopathy, index lesions are as follows: Subcarinal 2.7 cm short axis, right hilar 2 cm short axis, left hilar 2.1 cm short axis, AP window 2.2 cm short axis, right lower paratracheal 1.5 cm short axis, and left lower paratracheal 1.8 cm short axis. Right supraclavicular and retroclavicular adenopathy is also noted, the largest is supraclavicular measuring 1.3 cm short axis on the right. No thyromegaly or mass. The trachea and mainstem bronchi are patent without occlusion or intraluminal filling defects. The esophagus is not well visualized but there also appears to be a 1.5 cm paraesophageal lymph node along its distal aspect. Lungs/Pleura: There is a small right pleural effusion with adjacent atelectasis. Diffuse interstitial septal thickening is noted with tiny nonspecific subpleural nodular densities in the upper lobes bilaterally and ill-defined nonspecific subpleural right upper lobe pulmonary opacities which may reflect postinfectious or postinflammatory change. There is a more dominant 1 cm left upper lobe masslike opacity, series 7 image 71 which may represent a continuation of the patient's lymphadenopathy. Upper Abdomen: The partly included liver, spleen and adrenal glands demonstrate no acute appearing abnormalities. There are small retrocrural lymph nodes, the largest  approximately 0.8 cm. Musculoskeletal: No chest wall abnormality. No acute or significant osseous findings. Mild  midthoracic kyphosis attributable to degenerative disc disease. IMPRESSION: 1. The overarching finding is that of mediastinal, bilateral hilar, supraclavicular, retroclavicular as well as paraesophageal lymphadenopathy raising concern for lymphoma or repeat more remotely metastatic lymphadenopathy. 2. Septal thickening with small right effusion and patchy airspace opacities predominantly in the right upper lobe may reflect stigmata of pulmonary edema. Tiny nonspecific bilateral subpleural nodules are present which may also reflect changes of lymphoma or potentially metastatic disease. Infectious or inflammatory nodules not entirely excluded. 3. Aortic atherosclerosis. Aortic Atherosclerosis (ICD10-I70.0). Electronically Signed   By: Ashley Royalty M.D.   On: 02/17/2017 18:37   Ct Abdomen Pelvis W Contrast  Result Date: 02/23/2017 CLINICAL DATA:  Lymphadenopathy, possible lymphoma. Urosepsis. Dementia. EXAM: CT ABDOMEN AND PELVIS WITH CONTRAST TECHNIQUE: Multidetector CT imaging of the abdomen and pelvis was performed using the standard protocol following bolus administration of intravenous contrast. CONTRAST:  100 mL Isovue 300 IV COMPARISON:  Partial comparison is CT chest dated 02/17/2017 FINDINGS: Lower chest: Small to moderate pericardial effusion. Small right pleural effusion. Left infrahilar lymphadenopathy. These findings are better visualized on recent CT chest. Hepatobiliary: Macronodular hepatic contour with hepatomegaly. No focal hepatic lesion is seen. Status post cholecystectomy. No intrahepatic or extrahepatic duct dilatation. Pancreas: Within normal limits. Spleen: Within normal limits for size. Adrenals/Urinary Tract: Adrenal glands within normal limits. Heterogeneous perfusion of the right kidney with three focal hypoenhancing lesion measuring up to 1.6 cm in the lateral right upper  kidney (series 7/image 11). Mildly heterogeneous perfusion of the left kidney. This appearance is nonspecific but could reflect pyelonephritis given the clinical history of urosepsis. No hydronephrosis. Bladder is notable for layering excretory contrast (series 2/image 71). Stomach/Bowel: Stomach is within normal limits. No evidence of bowel obstruction. Mildly thick-walled loops of bowel in the central abdomen (series 2/ images 47 and 54), nonspecific, possibly reflecting infectious/inflammatory enteritis. Vascular/Lymphatic: No evidence of abdominal aortic aneurysm. Atherosclerotic calcifications of the abdominal aorta and branch vessels. Abdominopelvic lymphadenopathy, including: --1.2 cm short axis gastrohepatic node (series 2/ image 25) --1.2 cm short axis aortocaval node (series 2/ image 30) --1.7 cm short axis jejunal mesentery node (series 2/ image 42) --2.1 cm short axis right common iliac node (series 2/ image 50) --Bilateral obturator nodes measuring 2.0 cm short axis on the right and 1.8 cm short axis on the left (series 2/ image 61) --2.4 cm short axis right deep inguinal node (series 2/ image 62) --2.0 cm short axis right inguinal node (series 2/ image 77) Reproductive: Status post hysterectomy. Bilateral ovaries are unremarkable. Other: Trace pelvic ascites. Mild body wall edema. Musculoskeletal: Visualized osseous structures are within normal limits. IMPRESSION: Abdominopelvic lymphadenopathy, including a 2.0 cm short axis right inguinal node, suspicious for lymphoma. Heterogeneous perfusion of the bilateral kidneys, right greater than left. Three possible focal underlying lesions in the right kidney, measuring up to 1.6 cm. This appearance favors pyelonephritis in the setting of clinically suspected urosepsis, although renal lymphoma could have this appearance. Mildly thick-walled loops of small bowel in the central abdomen, nonspecific, possibly reflecting infectious/inflammatory enteritis. While  small bowel lymphoma is technically possible, this is considered unlikely given the long segment involvement. Macronodular hepatic contour with hepatomegaly. No focal hepatic lesion is seen. Small moderate pericardial effusion.  Small right pleural effusion. Electronically Signed   By: Julian Hy M.D.   On: 02/23/2017 14:26   Dg Femur Min 2 Views Right  Result Date: 03/01/2017 CLINICAL DATA:  Pain following fall EXAM: RIGHT FEMUR 2 VIEWS COMPARISON:  None. FINDINGS:  Frontal and lateral views were obtained. No fracture or dislocation. No appreciable knee joint effusion. No abnormal periosteal reaction. Joint spaces appear unremarkable. IMPRESSION: No fracture or dislocation. No appreciable arthropathy. No knee joint effusion. Electronically Signed   By: Lowella Grip III M.D.   On: 02/20/2017 13:55   US Abdomen Limited Ruq  Result Date: 02/07/2017 CLINICAL DATA:  Abnormal transaminases. Unspecified disorder of liver function. Cholecystectomy. EXAM: ULTRASOUND ABDOMEN LIMITED RIGHT UPPER QUADRANT COMPARISON:  None. FINDINGS: Gallbladder: Surgically absent. Common bile duct: Diameter: Normal at 4.9 mm.  No evidence of choledocholithiasis Liver: No focal lesion identified. No biliary dilatation. Hepatopetal flow within the main portal vein. IMPRESSION: 1. Status post cholecystectomy. 2. No ultrasound findings for the patient's elevated transaminase. Electronically Signed   By: Ashley Royalty M.D.   On: 02/19/2017 17:28    Microbiology: Recent Results (from the past 240 hour(s))  Urine Culture     Status: None   Collection Time: 03/02/2017 12:10 PM  Result Value Ref Range Status   Specimen Description URINE, CLEAN CATCH  Final   Special Requests NONE  Final   Culture   Final    NO GROWTH Performed at [facility], [address], [place], [place] [address]    Report Status 02/23/2017 FINAL  Final  Culture, blood (routine x 2)     Status: None   Collection Time: 02/17/2017  4:17 PM    Result Value Ref Range Status   Specimen Description BLOOD RIGHT ARM  Final   Special Requests   Final    BOTTLES DRAWN AEROBIC AND ANAEROBIC Blood Culture adequate volume   Culture   Final    NO GROWTH 5 DAYS Performed at [facility], [place] [address] [place], [place] [address]    Report Status 02/26/2017 FINAL  Final  Culture, blood (routine x 2)     Status: None   Collection Time: 02/19/2017  4:51 PM  Result Value Ref Range Status   Specimen Description BLOOD LEFT ARM  Final   Special Requests   Final    BOTTLES DRAWN AEROBIC AND ANAEROBIC Blood Culture adequate volume   Culture   Final    NO GROWTH 5 DAYS Performed at [facility] [address] [place], [place] [address]    Report Status 02/26/2017 FINAL  Final  MRSA PCR Screening     Status: None   Collection Time: 02/05/2017  7:04 PM  Result Value Ref Range Status   MRSA by PCR NEGATIVE NEGATIVE Final    Comment:        The GeneXpert MRSA Assay (FDA approved for NASAL specimens only), is one component of a comprehensive MRSA colonization surveillance program. It is not intended to diagnose MRSA infection nor to guide or monitor treatment for MRSA infections.      Labs: Basic Metabolic Panel:  Recent Labs Lab 03/05/2017 1652 02/22/17 0310 02/23/17 0320 02/12/2017 0411 03/03/2017 0848  NA 136 138 138 138 139  K 3.5 4.2 3.7 3.8 4.0  CL 96* 100* 103 103 104  CO2 21* 19* 16* 11* <7*  GLUCOSE 88 72 83 71 52*  BUN 24* 22* 27* 26* 28*  CREATININE 1.02* 0.93 0.97 0.94 1.12*  CALCIUM 8.9 8.8* 9.0 8.9 9.5   Liver Function Tests:  Recent Labs Lab 02/23/2017 1228 02/22/17 0310 02/23/17 0320 03/04/2017 0411  AST 355* 332* 374* 445*  ALT 53 52 60* 71*  ALKPHOS 330* 330* 384* 421*  BILITOT 1.3*  1.4* 1.8* 2.0*  PROT 5.5* 5.4* 5.4* 5.5*  ALBUMIN 2.9* 2.7* 2.8* 2.7*   No results for input(s): LIPASE, AMYLASE in the last 168 hours.  Recent Labs Lab 02/20/2017 1618  AMMONIA 29   CBC:  Recent Labs Lab  02/15/2017 1228 02/14/2017 1618 02/22/17 0310 02/23/17 0320 02/03/2017 0411  WBC 13.8*  --  19.5* 16.2* 20.7*  NEUTROABS 8.7*  --   --   --   --   HGB 9.4*  --  9.3* 9.5* 8.6*  HCT 27.5*  --  27.2* 28.0* 26.6*  MCV 85.9  --  86.1 85.1 88.7  PLT 33* 37* 48* 32* 42*   Cardiac Enzymes: No results for input(s): CKTOTAL, CKMB, CKMBINDEX, TROPONINI in the last 168 hours. D-Dimer No results for input(s): DDIMER in the last 72 hours. BNP: Invalid input(s): POCBNP CBG:  Recent Labs Lab 02/04/2017 0950 02/03/2017 1007  GLUCAP 45* 123*   Anemia work up No results for input(s): VITAMINB12, FOLATE, FERRITIN, TIBC, IRON, RETICCTPCT in the last 72 hours. Urinalysis    Component Value Date/Time   COLORURINE BROWN (A) 02/13/2017 1210   APPEARANCEUR CLOUDY (A) 03/01/2017 1210   LABSPEC 1.015 02/14/2017 1210   PHURINE 6.0 02/05/2017 1210   GLUCOSEU NEGATIVE 02/15/2017 1210   HGBUR LARGE (A) 02/23/2017 1210   BILIRUBINUR NEGATIVE 02/07/2017 1210   KETONESUR 5 (A) 03/05/2017 1210   PROTEINUR 100 (A) 02/08/2017 1210   NITRITE NEGATIVE 02/20/2017 1210   LEUKOCYTESUR SMALL (A) 02/20/2017 1210   Sepsis Labs Invalid input(s): PROCALCITONIN,  WBC,  LACTICIDVEN     SIGNED:  Elmarie Shiley, MD  [facility] 02/26/2017, 8:54 PM Pager   If 7PM-7AM, please contact night-coverage [email or web address] Password [email or web address]

## 2017-03-06 DEATH — deceased

## 2017-06-26 ENCOUNTER — Other Ambulatory Visit: Payer: Self-pay | Admitting: Oncology

## 2017-06-26 ENCOUNTER — Encounter: Payer: Self-pay | Admitting: Oncology

## 2017-06-26 ENCOUNTER — Telehealth: Payer: Self-pay | Admitting: *Deleted

## 2017-06-26 NOTE — Telephone Encounter (Signed)
Received letter from pt's daughter, [name], requesting a letter from Dr Jannifer Rodney regarding diagnosis of her mother for filing of a cancer policy.  Helene Kelp contacted and informed- verified mailing address.  Letter obtained- and mailed to [place] at [facility] [facility] [address], [place],[id number].

## 2017-06-26 NOTE — Progress Notes (Signed)
[place]  [phone] [phone]) [phone]     ID: Gail Noble DOB: 1933/12/11  MR#: [id number]  HTD#:[id number]  Patient Care Team: Shirline Frees, MD as PCP - General (Family Medicine) Shirline Frees, MD as Consulting Physician (Family Medicine) Chauncey Cruel, MD OTHER MD:  CHIEF COMPLAINT: abnormal blood counts  CURRENT TREATMENT: supportive care, further evaluation   HISTORY OF CURRENT ILLNESS: I do not have CBC results earlier than today as the patient's PCP, Gail Noble' records do not cross over to [facility]. Per the patient's daughter, Gail Noble ["Gail Noble"] has been having bruises the family thought might be due to falls, which the patient denies (the patient has mild dementia and may not be a reliable historian). Yesterday the patient was very weak and the family took her to Gail Noble' office where she was founf to have a UTI and ws started on cipro (I do not have tjhose records).  This morning however the patient felt even weaker. She was directed to the ED where she was found to be anemic and thrombocytopenic, with an elevated WBC. She was admitted for evaluation and tratment of possible sepsis and we were consulted to evaluate the blood count abnormalities  The patient's subsequent history is as detailed below.  INTERVAL HISTORY: I met with the patient in her hospital room and with the patient's daughter, Gail Noble, in the hallway  REVIEW OF SYSTEMS: As above. The patient denies dysuria or frequency, but says she has noted blood in her urine on two occasions. She denies h/a, N/V, pain on swallowing or difficulty swallowing, hemoptysis, BRBPR or other overt bleeding.  PAST MEDICAL HISTORY: Past Medical History:  Diagnosis Date  . Depression   . Generalized headaches     PAST SURGICAL HISTORY: Past Surgical History:  Procedure Laterality Date  . ABDOMINAL HYSTERECTOMY    . CHOLECYSTECTOMY    . EYE SURGERY    . TOE SURGERY       FAMILY HISTORY Family History  Problem Relation Age of Onset  . Lymphoma Mother   . Alzheimer's disease Father     GYNECOLOGIC HISTORY:  No LMP recorded. Patient has had a hysterectomy.  ADVANCED DIRECTIVES: full code at this point; the patient's daughetr Gail Noble is her HCPOA   HEALTH MAINTENANCE: Social History   Tobacco Use  . Smoking status: Never Smoker  . Smokeless tobacco: Never Used  Substance Use Topics  . Alcohol use: No  . Drug use: No     No Known Allergies  Current Outpatient Medications  Medication Sig Dispense Refill  . acetaminophen (TYLENOL) 500 MG tablet Take 1,000 mg by mouth daily as needed for headache.    Marland Kitchen atorvastatin (LIPITOR) 20 MG tablet Take 20 mg by mouth daily.    . ciprofloxacin (CIPRO) 500 MG tablet Take 500 mg by mouth 2 (two) times daily.    . clonazePAM (KLONOPIN) 1 MG tablet Take 0.5-1 mg by mouth daily as needed for anxiety.    . diazepam (VALIUM) 5 MG tablet Take 5 mg by mouth every 6 (six) hours as needed.    . donepezil (ARICEPT) 10 MG tablet Take 10 mg by mouth at bedtime.    . fish oil-omega-3 fatty acids 1000 MG capsule Take 2 g by mouth daily.    Marland Kitchen ibuprofen (ADVIL,MOTRIN) 200 MG tablet Take 800 mg by mouth 2 (two) times daily as needed for headache or moderate pain.    Marland Kitchen sertraline (ZOLOFT) 100 MG tablet Take 100 mg  by mouth daily. Take 2 tablets daily     No current facility-administered medications for this visit.     OBJECTIVE: eldferly White woman examined in bed  There were no vitals filed for this visit.   There is no height or weight on file to calculate BMI.   Wt Readings from Last 3 Encounters:  02/09/2017 154 lb 8.7 oz (70.1 kg)  01/15/17 132 lb (59.9 kg)    Ocular: Sclerae unicteric, pupils round and equal Lungs no rales or rhonchi--auscultated anterolaterally Heart regular rate and rhythm Abd positive bowel sounds; did not palpate spleen tip Neuro: non-focal, pleasantaffect Breasts:  deferred   LAB RESULTS:  CMP     Component Value Date/Time   NA 139 02/13/2017 0848   K 4.0 02/06/2017 0848   CL 104 02/06/2017 0848   CO2 <7 (L) 02/09/2017 0848   GLUCOSE 52 (L) 03/05/2017 0848   BUN 28 (H) 02/11/2017 0848   CREATININE 1.12 (H) 02/04/2017 0848   CALCIUM 9.5 02/17/2017 0848   PROT 5.5 (L) 02/10/2017 0411   ALBUMIN 2.7 (L) 02/28/2017 0411   AST 445 (H) 03/01/2017 0411   ALT 71 (H) 02/08/2017 0411   ALKPHOS 421 (H) 02/15/2017 0411   BILITOT 2.0 (H) 03/03/2017 0411   GFRNONAA 45 (L) 02/20/2017 0848   GFRAA 52 (L) 02/14/2017 0848    No results found for: TOTALPROTELP, ALBUMINELP, A1GS, A2GS, BETS, BETA2SER, GAMS, MSPIKE, SPEI  No results found for: KPAFRELGTCHN, LAMBDASER, KAPLAMBRATIO  Lab Results  Component Value Date   WBC 20.7 (H) 02/09/2017   NEUTROABS 8.7 (H) 02/16/2017   HGB 8.6 (L) 02/23/2017   HCT 26.6 (L) 02/23/2017   MCV 88.7 02/13/2017   PLT 42 (L) 02/17/2017    _0 @  No results found for: LABCA2  No components found for: [id number]  No results for input(s): INR in the last 168 hours.  Urinalysis    Component Value Date/Time   COLORURINE BROWN (A) 02/17/2017 1210   APPEARANCEUR CLOUDY (A) 03/03/2017 1210   LABSPEC 1.015 03/02/2017 1210   PHURINE 6.0 02/13/2017 1210   GLUCOSEU NEGATIVE 02/19/2017 1210   HGBUR LARGE (A) 02/03/2017 1210   BILIRUBINUR NEGATIVE 02/13/2017 1210   KETONESUR 5 (A) 03/02/2017 1210   PROTEINUR 100 (A) 02/17/2017 1210   NITRITE NEGATIVE 02/03/2017 1210   LEUKOCYTESUR SMALL (A) 02/17/2017 1210     STUDIES: No results found.  ASSESSMENT: 83 y.o. 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 Metastatic cancer to the marrow can present the same way.   I think the most efficient way of working up the blood problem is to do a bone marrow biopsy. I discussed that with the patient's daughter and she is in agreement. I will write for it to be done tomorrow if possible. Otherwise it can be done as outpatient if Denice Paradise is discharged before the test can be done.   I am also writing for flow cytometry from the patient's blood. which certainly can be done in AM and can give [place] preliminary data. However if the bone marrow can be done tomorrow the flow cytometry should be done on the marrow and  the test on the blood can be cancelled.  I will be out of town for the next 4 days but will ask one of my partners to look in on Gail Noble in my absence.    Chauncey Cruel, MD   06/26/2017 7:39 AM Medical Oncology and Hematology [facility] [address] [place], [place] [address] Tel. [phone]    Fax. [phone]

## 2017-07-19 ENCOUNTER — Ambulatory Visit: Payer: Medicare HMO | Admitting: Neurology

## 2017-07-22 ENCOUNTER — Encounter: Payer: Self-pay | Admitting: *Deleted

## 2018-02-05 IMAGING — CT CT HEAD W/O CM
4 series · 17 of 47 positions shown, 19 images · non-contrast
Comparison: MRI brain 01/25/2017

CLINICAL DATA: Confusion, headaches and dementia.

EXAM:
CT HEAD WITHOUT CONTRAST
TECHNIQUE: Contiguous axial images were obtained from the base of the skull
through the vertex without intravenous contrast.

[Series 3: head bone · axial · 0.49mm/px · z∈[-34,-1]mm · 3 of 63 slices shown]
[im 7/63  bone]
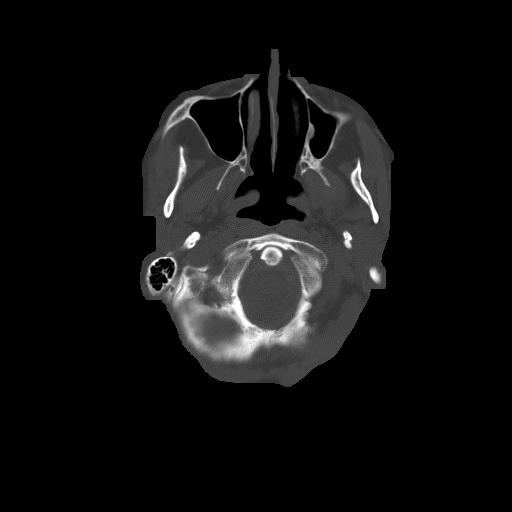
[im 14/63  bone]
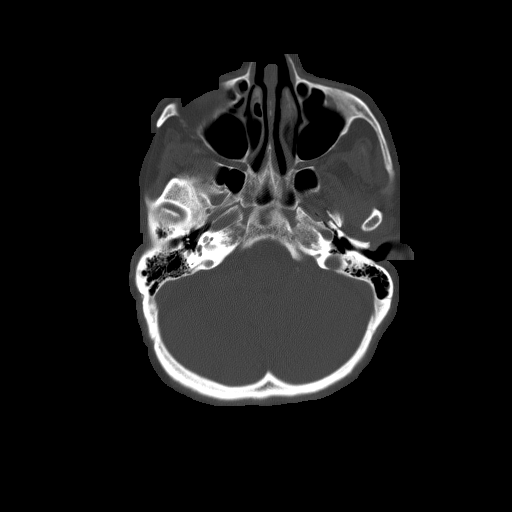
[im 20/63  bone]
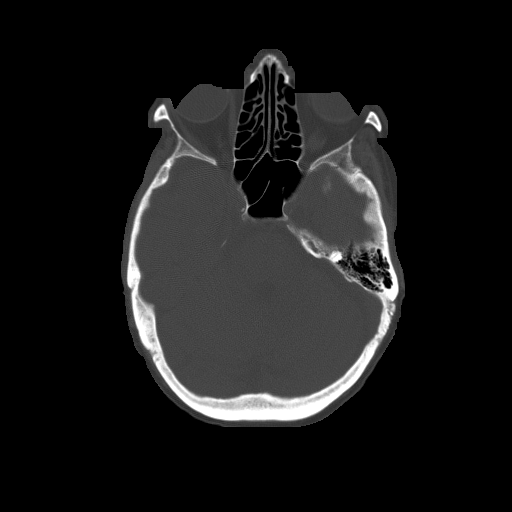

[Series 32: 3d filtered head w/o · axial · non-contrast · 0.49mm/px · z∈[-34,+86]mm · 8 of 32 slices shown, 10 images]
[im 4/32  brain]
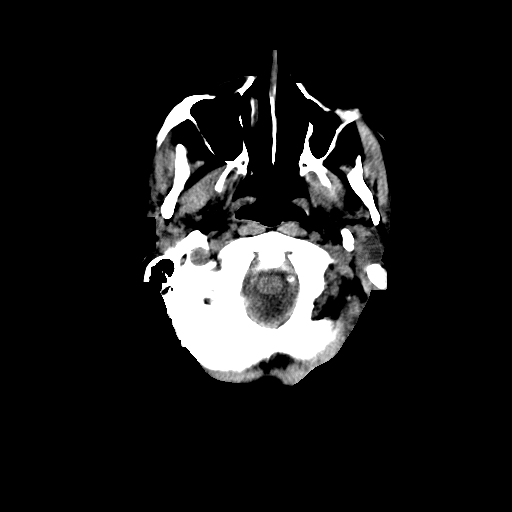
[im 4/32  bone]
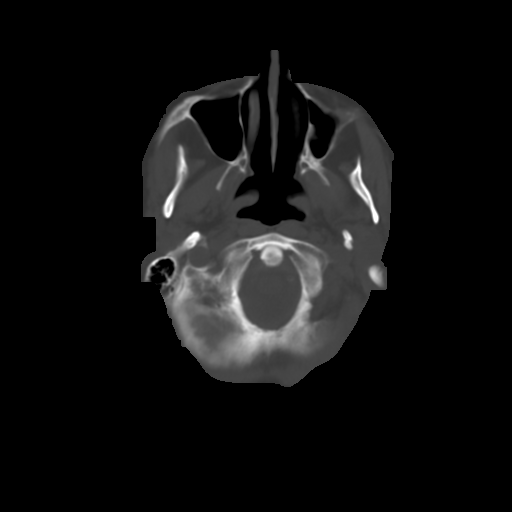
[im 7/32  brain]
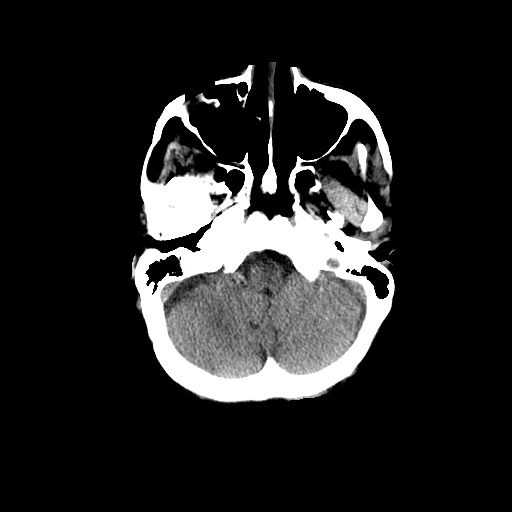
[im 11/32  brain]
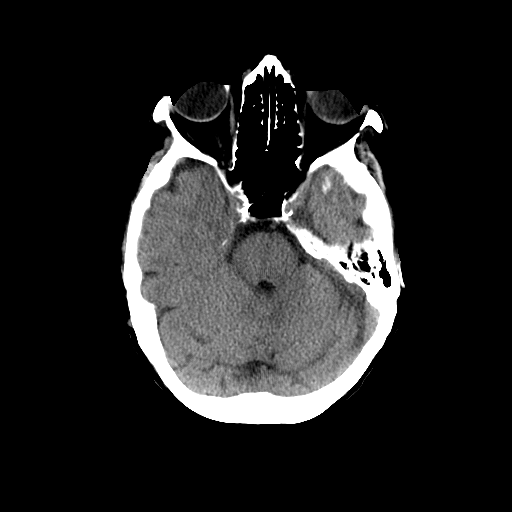
[im 14/32  brain]
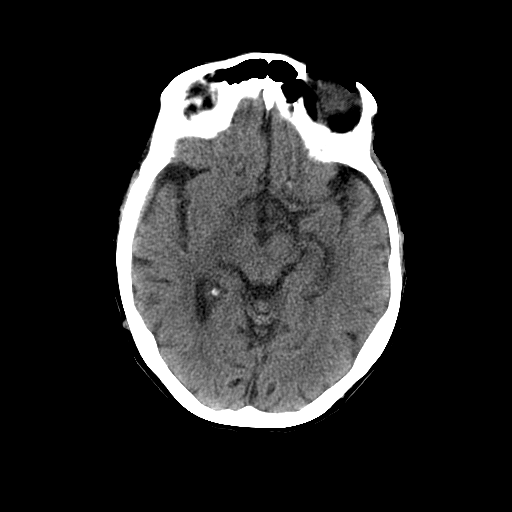
[im 18/32  brain]
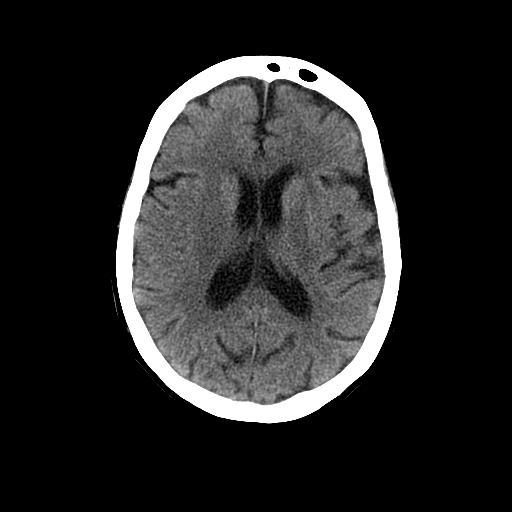
[im 18/32  bone]
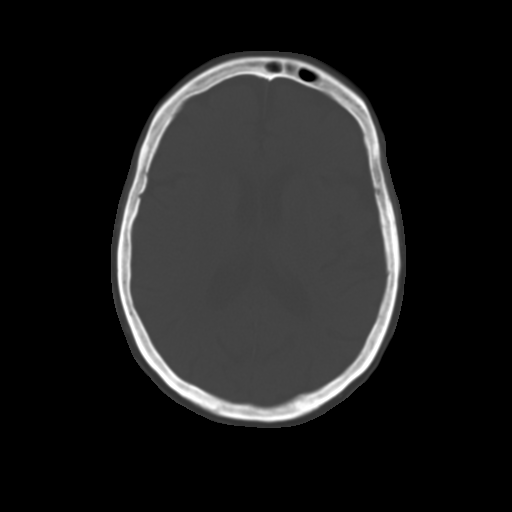
[im 21/32  brain]
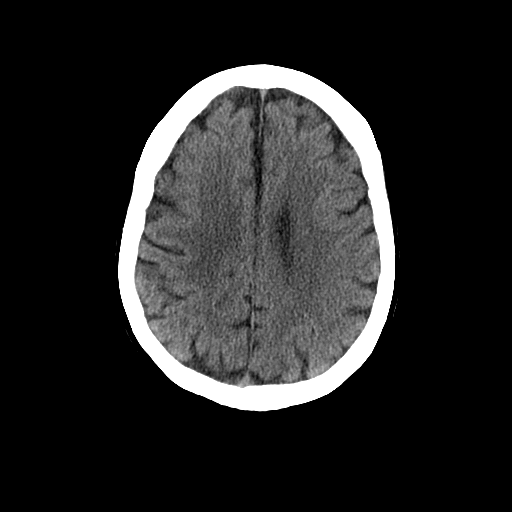
[im 25/32  brain]
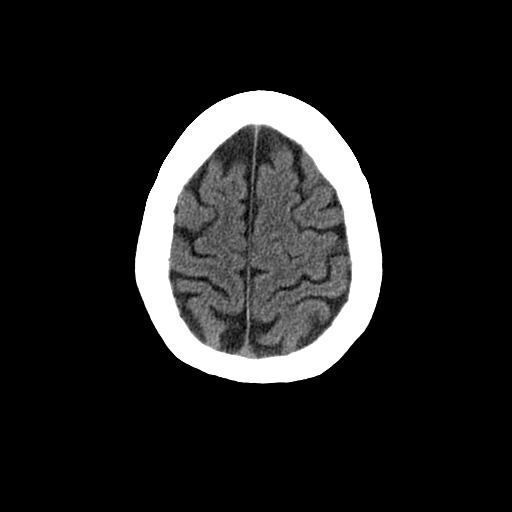
[im 28/32  brain]
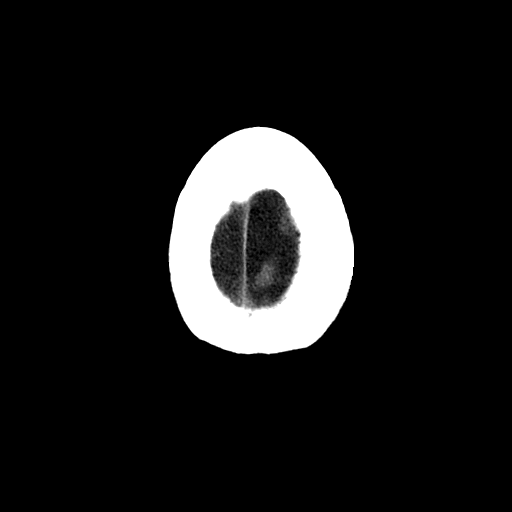

[Series 601: coronal brain · coronal · 0.49mm/px · 3 of 70 slices shown]
[im 24/70  brain]
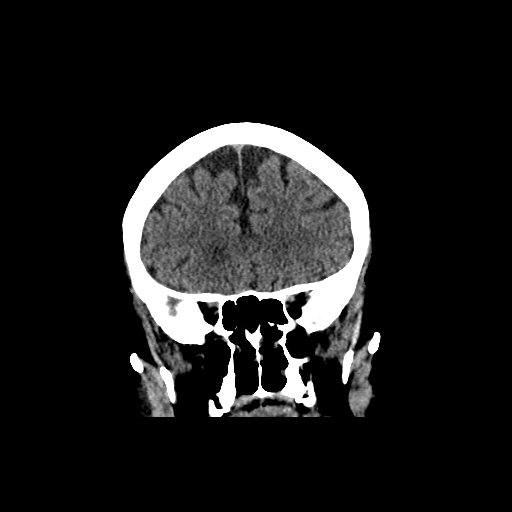
[im 31/70  brain]
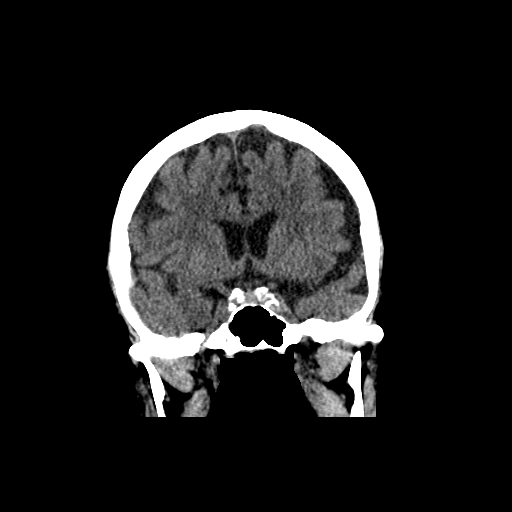
[im 39/70  brain]
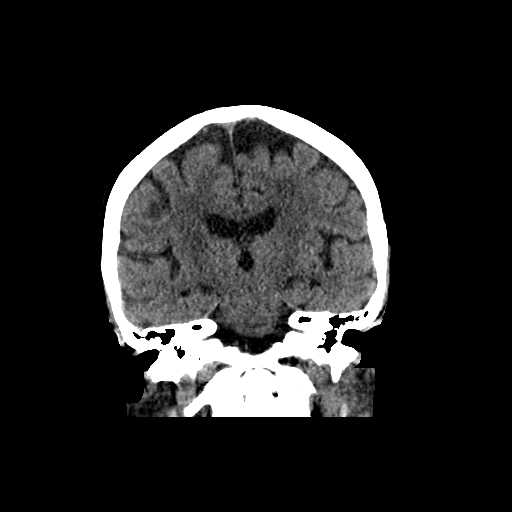

[Series 602: sagittal brain · sagittal · 0.49mm/px · 3 of 56 slices shown]
[im 19/56  brain]
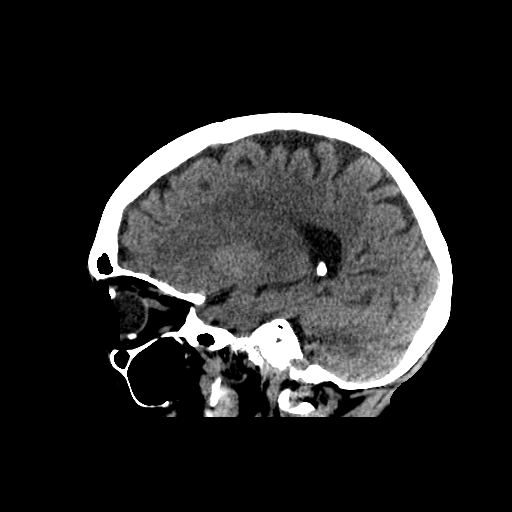
[im 28/56  brain]
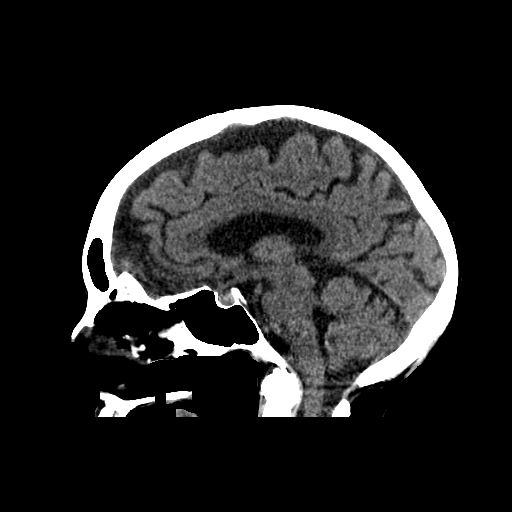
[im 37/56  brain]
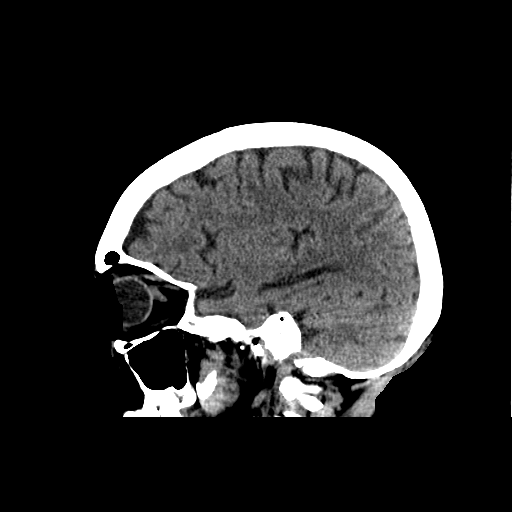

[17 of 47 positions shown; findings below may reference images not displayed]

FINDINGS: Brain: Stable age related cerebral atrophy, ventriculomegaly and
periventricular white matter disease. No extra-axial fluid
collections are identified. No CT findings for acute hemispheric
infarction or intracranial hemorrhage. No mass lesions. The
brainstem and cerebellum are normal.

Vascular: Stable vascular calcifications. No hyperdense vessels or
definite aneurysm.

Skull: No skull fracture bone lesion.

Sinuses/Orbits: The paranasal sinuses and mastoid air cells are
grossly clear. The globes are intact.

Other: No scalp lesions or hematoma.
IMPRESSION: 1. Age related cerebral atrophy, ventriculomegaly and
periventricular white matter disease.
2. No acute intracranial findings or skull fracture.
